# Patient Record
Sex: Male | Born: 1985
Health system: Southern US, Community
[De-identification: ages and names within clinical notes are randomized; demographics above are authoritative.]

## PROBLEM LIST (undated history)

## (undated) DIAGNOSIS — G709 Myoneural disorder, unspecified: Secondary | ICD-10-CM

## (undated) DIAGNOSIS — K219 Gastro-esophageal reflux disease without esophagitis: Secondary | ICD-10-CM

## (undated) DIAGNOSIS — F1910 Other psychoactive substance abuse, uncomplicated: Secondary | ICD-10-CM

## (undated) DIAGNOSIS — F329 Major depressive disorder, single episode, unspecified: Secondary | ICD-10-CM

## (undated) DIAGNOSIS — T7840XA Allergy, unspecified, initial encounter: Secondary | ICD-10-CM

## (undated) DIAGNOSIS — K5100 Ulcerative (chronic) pancolitis without complications: Secondary | ICD-10-CM

## (undated) DIAGNOSIS — F32A Depression, unspecified: Secondary | ICD-10-CM

## (undated) DIAGNOSIS — K5130 Ulcerative (chronic) rectosigmoiditis without complications: Secondary | ICD-10-CM

## (undated) DIAGNOSIS — F419 Anxiety disorder, unspecified: Secondary | ICD-10-CM

## (undated) HISTORY — DX: Ulcerative (chronic) pancolitis without complications: K51.00

## (undated) HISTORY — DX: Myoneural disorder, unspecified: G70.9

## (undated) HISTORY — DX: Gastro-esophageal reflux disease without esophagitis: K21.9

## (undated) HISTORY — DX: Allergy, unspecified, initial encounter: T78.40XA

## (undated) HISTORY — PX: OTHER SURGICAL HISTORY: SHX169

## (undated) HISTORY — DX: Depression, unspecified: F32.A

## (undated) HISTORY — DX: Anxiety disorder, unspecified: F41.9

## (undated) HISTORY — DX: Ulcerative (chronic) rectosigmoiditis without complications: K51.30

## (undated) HISTORY — DX: Major depressive disorder, single episode, unspecified: F32.9

## (undated) HISTORY — DX: Other psychoactive substance abuse, uncomplicated: F19.10

## (undated) HISTORY — PX: COLONOSCOPY: SHX174

---
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
## 2009-04-25 HISTORY — PX: HARDWARE REMOVAL: SHX979

## 2014-06-11 ENCOUNTER — Ambulatory Visit: Payer: 59 | Admitting: Internal Medicine

## 2014-06-12 ENCOUNTER — Ambulatory Visit (INDEPENDENT_AMBULATORY_CARE_PROVIDER_SITE_OTHER): Payer: 59 | Admitting: Internal Medicine

## 2014-06-12 ENCOUNTER — Encounter: Payer: Self-pay | Admitting: Internal Medicine

## 2014-06-12 VITALS — BP 118/80 | HR 69 | Temp 98.4°F | Resp 16 | Ht 69.0 in | Wt 183.0 lb

## 2014-06-12 DIAGNOSIS — Z0000 Encounter for general adult medical examination without abnormal findings: Secondary | ICD-10-CM | POA: Insufficient documentation

## 2014-06-12 DIAGNOSIS — F1011 Alcohol abuse, in remission: Secondary | ICD-10-CM | POA: Insufficient documentation

## 2014-06-12 DIAGNOSIS — F411 Generalized anxiety disorder: Secondary | ICD-10-CM

## 2014-06-12 NOTE — Assessment & Plan Note (Signed)
Recent labs done while in [facility] so no labs ordered today Vaccines were reviewed and documented Exam done Pt ed material was given

## 2014-06-12 NOTE — Progress Notes (Signed)
   Subjective:    Patient ID: Blake Chapman, male    DOB: January 24, 1986, 29 y.o.   MRN: [id number]  HPI  New to me he comes in to establish with a PCP - he is 4 months sober from EtOH after drinking heavily for years and developing a peripheral neuropathy. He completed about 3 months of inpt rehab at [facility]. He has improved quite a bit in the recent months. He tells me that a NCS and EMG were done in [place] by his father who is a pediatric neurologist.  Review of Systems  Constitutional: Negative.  Negative for fever, chills, diaphoresis, appetite change and fatigue.  HENT: Negative.   Eyes: Negative.   Respiratory: Negative.  Negative for cough, choking, shortness of breath and stridor.   Cardiovascular: Negative.  Negative for chest pain, palpitations and leg swelling.  Gastrointestinal: Negative.  Negative for nausea, vomiting, abdominal pain, diarrhea, constipation and blood in stool.  Endocrine: Negative.   Genitourinary: Negative.   Musculoskeletal: Negative.   Skin: Negative.   Allergic/Immunologic: Negative.   Neurological: Negative.   Hematological: Negative.  Negative for adenopathy. Does not bruise/bleed easily.  Psychiatric/Behavioral: Positive for dysphoric mood. Negative for suicidal ideas, hallucinations, behavioral problems, confusion, sleep disturbance, self-injury, decreased concentration and agitation. The patient is nervous/anxious. The patient is not hyperactive.        Objective:   Physical Exam  Constitutional: He is oriented to person, place, and time. He appears well-developed and well-nourished. No distress.  HENT:  Head: Normocephalic and atraumatic.  Mouth/Throat: Oropharynx is clear and moist. No oropharyngeal exudate.  Eyes: Conjunctivae are normal. Right eye exhibits no discharge. Left eye exhibits no discharge. No scleral icterus.  Neck: Normal range of motion. Neck supple. No JVD present. No tracheal deviation present. No thyromegaly present.    Cardiovascular: Normal rate, regular rhythm, normal heart sounds and intact distal pulses.  Exam reveals no gallop and no friction rub.   No murmur heard. Pulmonary/Chest: Effort normal and breath sounds normal. No stridor. No respiratory distress. He has no wheezes. He has no rales. He exhibits no tenderness.  Abdominal: Soft. Bowel sounds are normal. He exhibits no distension and no mass. There is no tenderness. There is no rebound and no guarding.  Musculoskeletal: Normal range of motion. He exhibits no edema or tenderness.  Lymphadenopathy:    He has no cervical adenopathy.  Neurological: He is oriented to person, place, and time.  Skin: Skin is warm and dry. No rash noted. He is not diaphoretic. No erythema. No pallor.  Vitals reviewed.   No results found for: WBC, HGB, HCT, PLT, GLUCOSE, CHOL, TRIG, HDL, LDLDIRECT, LDLCALC, ALT, AST, NA, K, CL, CREATININE, BUN, CO2, TSH, PSA, INR, GLUF, HGBA1C, MICROALBUR      Assessment & Plan:

## 2014-06-12 NOTE — Patient Instructions (Signed)

## 2014-10-07 ENCOUNTER — Telehealth: Payer: Self-pay | Admitting: Internal Medicine

## 2014-10-07 MED ORDER — GABAPENTIN 600 MG PO TABS: 600.0000 mg | ORAL_TABLET | Freq: Three times a day (TID) | ORAL | Status: DC

## 2014-10-07 MED ORDER — PROPRANOLOL HCL 10 MG PO TABS: 10.0000 mg | ORAL_TABLET | ORAL | Status: DC | PRN

## 2014-10-07 MED ORDER — DULOXETINE HCL 60 MG PO CPEP: 60.0000 mg | ORAL_CAPSULE | Freq: Two times a day (BID) | ORAL | Status: DC

## 2014-10-07 NOTE — Telephone Encounter (Signed)
Notified pt refill sent to pharmacy...Blake Chapman

## 2014-10-07 NOTE — Telephone Encounter (Signed)
Pt called in and it is time for his refills, he needs refill on:  Gabapentin Propranolol Duloxetine    [facility] - pharmacy

## 2014-11-05 ENCOUNTER — Other Ambulatory Visit: Payer: Self-pay

## 2014-11-05 MED ORDER — PROPRANOLOL HCL 10 MG PO TABS: 10.0000 mg | ORAL_TABLET | ORAL | Status: DC | PRN

## 2015-01-09 ENCOUNTER — Other Ambulatory Visit: Payer: Self-pay

## 2015-01-09 MED ORDER — PROPRANOLOL HCL 10 MG PO TABS: 10.0000 mg | ORAL_TABLET | ORAL | Status: DC | PRN

## 2015-04-09 ENCOUNTER — Other Ambulatory Visit: Payer: Self-pay | Admitting: Internal Medicine

## 2015-08-10 ENCOUNTER — Other Ambulatory Visit: Payer: Self-pay | Admitting: Internal Medicine

## 2015-09-10 ENCOUNTER — Other Ambulatory Visit: Payer: Self-pay | Admitting: Internal Medicine

## 2015-09-17 ENCOUNTER — Encounter: Payer: Self-pay | Admitting: Internal Medicine

## 2015-09-17 ENCOUNTER — Ambulatory Visit (INDEPENDENT_AMBULATORY_CARE_PROVIDER_SITE_OTHER): Payer: 59 | Admitting: Internal Medicine

## 2015-09-17 ENCOUNTER — Other Ambulatory Visit (INDEPENDENT_AMBULATORY_CARE_PROVIDER_SITE_OTHER): Payer: 59

## 2015-09-17 VITALS — BP 120/86 | HR 78 | Temp 98.9°F | Resp 16 | Ht 69.0 in | Wt 165.0 lb

## 2015-09-17 DIAGNOSIS — Z0000 Encounter for general adult medical examination without abnormal findings: Secondary | ICD-10-CM

## 2015-09-17 DIAGNOSIS — F1011 Alcohol abuse, in remission: Secondary | ICD-10-CM

## 2015-09-17 DIAGNOSIS — F411 Generalized anxiety disorder: Secondary | ICD-10-CM

## 2015-09-17 DIAGNOSIS — F1010 Alcohol abuse, uncomplicated: Secondary | ICD-10-CM | POA: Diagnosis not present

## 2015-09-17 DIAGNOSIS — K648 Other hemorrhoids: Secondary | ICD-10-CM | POA: Diagnosis not present

## 2015-09-17 DIAGNOSIS — K921 Melena: Secondary | ICD-10-CM

## 2015-09-17 LAB — CBC WITH DIFFERENTIAL/PLATELET
BASOS ABS: 0 10*3/uL (ref 0.0–0.1)
Basophils Relative: 0.5 % (ref 0.0–3.0)
Eosinophils Absolute: 0.4 10*3/uL (ref 0.0–0.7)
Eosinophils Relative: 5.1 % — ABNORMAL HIGH (ref 0.0–5.0)
HCT: 41.8 % (ref 39.0–52.0)
Hemoglobin: 14.4 g/dL (ref 13.0–17.0)
LYMPHS ABS: 1.3 10*3/uL (ref 0.7–4.0)
Lymphocytes Relative: 16.4 % (ref 12.0–46.0)
MCHC: 34.5 g/dL (ref 30.0–36.0)
MCV: 89.3 fl (ref 78.0–100.0)
MONO ABS: 0.6 10*3/uL (ref 0.1–1.0)
MONOS PCT: 7.8 % (ref 3.0–12.0)
NEUTROS ABS: 5.4 10*3/uL (ref 1.4–7.7)
NEUTROS PCT: 70.2 % (ref 43.0–77.0)
PLATELETS: 284 10*3/uL (ref 150.0–400.0)
RBC: 4.68 Mil/uL (ref 4.22–5.81)
RDW: 13.2 % (ref 11.5–15.5)
WBC: 7.7 10*3/uL (ref 4.0–10.5)

## 2015-09-17 LAB — LIPID PANEL
CHOLESTEROL: 169 mg/dL (ref 0–200)
HDL: 50.5 mg/dL (ref >39.00)
LDL Cholesterol: 90 mg/dL (ref 0–99)
NONHDL: 118.41
TRIGLYCERIDES: 142 mg/dL (ref 0.0–149.0)
Total CHOL/HDL Ratio: 3
VLDL: 28.4 mg/dL (ref 0.0–40.0)

## 2015-09-17 LAB — COMPREHENSIVE METABOLIC PANEL
ALT: 20 U/L (ref 0–53)
AST: 24 U/L (ref 0–37)
Albumin: 4.5 g/dL (ref 3.5–5.2)
Alkaline Phosphatase: 48 U/L (ref 39–117)
BILIRUBIN TOTAL: 0.3 mg/dL (ref 0.2–1.2)
BUN: 8 mg/dL (ref 6–23)
CALCIUM: 9.5 mg/dL (ref 8.4–10.5)
CO2: 31 meq/L (ref 19–32)
CREATININE: 0.95 mg/dL (ref 0.40–1.50)
Chloride: 101 mEq/L (ref 96–112)
GFR: 97.61 mL/min (ref >60.00)
Glucose, Bld: 75 mg/dL (ref 70–99)
Potassium: 4.7 mEq/L (ref 3.5–5.1)
Sodium: 136 mEq/L (ref 135–145)
TOTAL PROTEIN: 7.2 g/dL (ref 6.0–8.3)

## 2015-09-17 LAB — TSH: TSH: 1.15 u[IU]/mL (ref 0.35–4.50)

## 2015-09-17 LAB — FECAL OCCULT BLOOD, GUAIAC: Fecal Occult Blood: POSITIVE

## 2015-09-17 MED ORDER — DULOXETINE HCL 60 MG PO CPEP: ORAL_CAPSULE | ORAL | Status: DC

## 2015-09-17 MED ORDER — NALTREXONE HCL 50 MG PO TABS: 50.0000 mg | ORAL_TABLET | Freq: Every day | ORAL | Status: DC

## 2015-09-17 MED ORDER — PROPRANOLOL HCL 10 MG PO TABS: 10.0000 mg | ORAL_TABLET | Freq: Every day | ORAL | Status: DC | PRN

## 2015-09-17 NOTE — Progress Notes (Signed)
Subjective:  Patient ID: Blake Chapman, male    DOB: 04-12-1986  Age: 30 y.o. MRN: [id number]  CC: Annual Exam; Depression; and Blood In Stools   HPI Blake Chapman presents for a CPX.  He complains of a 2-3 week history of painless but bloody stools with blood on the toilet paper.  He has performance anxiety and occasionally takes propranolol for symptom relief. Since I last saw him he has been able to stop taking gabapentin and mirtazapine. He continues to abstain from alcohol intake and is doing well on duloxetine at the current dose of twice a day. He complains of low libido and erectile dysfunction. He asked me to fill a prescription for naltrexone. He tells me that he is still having episodes of craving alcohol and when he was in rehab he took naltrexone and said it helped him quite a bit and he tolerated it very well.  Outpatient Prescriptions Prior to Visit  Medication Sig Dispense Refill  . fluticasone (FLONASE) 50 MCG/ACT nasal spray Place into both nostrils daily.    . Loratadine (CLARITIN PO) Take by mouth.    . Multiple Vitamin (MULTIVITAMIN) capsule Take 1 capsule by mouth daily.    Marland Kitchen VITAMIN D, CHOLECALCIFEROL, PO Take by mouth.    . DULoxetine (CYMBALTA) 60 MG capsule TAKE 1 CAPSULE (60 MG TOTAL) BY MOUTH 2 (TWO) TIMES DAILY. 60 capsule 5  . MELATONIN PO Take by mouth.    . Mirtazapine (REMERON PO) Take by mouth.    . propranolol (INDERAL) 10 MG tablet Take 1 tablet (10 mg total) by mouth daily as needed. Overdue for yearlyphysical w/labs must see md for refills 30 tablet 0  . gabapentin (NEURONTIN) 600 MG tablet Take 1 tablet (600 mg total) by mouth 3 (three) times daily. 90 tablet 5   No facility-administered medications prior to visit.    ROS Review of Systems  Constitutional: Negative.  Negative for fever, chills, diaphoresis, appetite change and fatigue.  HENT: Negative.  Negative for sore throat and trouble swallowing.   Eyes: Negative.  Negative for visual  disturbance.  Respiratory: Negative.  Negative for cough, choking, chest tightness, shortness of breath and stridor.   Cardiovascular: Negative.  Negative for chest pain, palpitations and leg swelling.  Gastrointestinal: Positive for blood in stool and anal bleeding. Negative for nausea, vomiting, abdominal pain, diarrhea and constipation.  Endocrine: Negative.   Genitourinary: Negative.  Negative for dysuria, frequency, decreased urine volume, discharge, penile swelling, scrotal swelling, difficulty urinating and testicular pain.  Musculoskeletal: Negative.  Negative for myalgias, back pain, joint swelling, arthralgias and neck pain.  Skin: Negative.  Negative for color change and rash.  Allergic/Immunologic: Negative.   Neurological: Negative.   Hematological: Negative.  Negative for adenopathy. Does not bruise/bleed easily.  Psychiatric/Behavioral: Negative.     Objective:  BP 120/86 mmHg  Pulse 78  Temp(Src) 98.9 F (37.2 C) (Oral)  Resp 16  Ht 5' 9"  (1.753 m)  Wt 165 lb (74.844 kg)  BMI 24.36 kg/m2  SpO2 98%  BP Readings from Last 3 Encounters:  09/17/15 120/86  06/12/14 118/80    Wt Readings from Last 3 Encounters:  09/17/15 165 lb (74.844 kg)  06/12/14 183 lb (83.008 kg)    Physical Exam  Constitutional: He is oriented to person, place, and time. He appears well-developed and well-nourished. No distress.  HENT:  Nose: Nose normal.  Mouth/Throat: Oropharynx is clear and moist. No oropharyngeal exudate.  Eyes: Conjunctivae are normal. Right eye exhibits no  discharge. Left eye exhibits no discharge. No scleral icterus.  Neck: Normal range of motion. Neck supple. No JVD present. No tracheal deviation present. No thyromegaly present.  Cardiovascular: Normal rate, regular rhythm, normal heart sounds and intact distal pulses.  Exam reveals no gallop and no friction rub.   No murmur heard. Pulmonary/Chest: Effort normal and breath sounds normal. No stridor. No respiratory  distress. He has no wheezes. He has no rales. He exhibits no tenderness.  Abdominal: Soft. Bowel sounds are normal. He exhibits no distension and no mass. There is no tenderness. There is no rebound and no guarding. Hernia confirmed negative in the right inguinal area and confirmed negative in the left inguinal area.  Genitourinary: Prostate normal, testes normal and penis normal. Rectal exam shows external hemorrhoid and internal hemorrhoid. Rectal exam shows no fissure, no mass, no tenderness and anal tone normal. Guaiac positive stool. Prostate is not enlarged and not tender. Right testis shows no mass, no swelling and no tenderness. Right testis is descended. Left testis shows no mass, no swelling and no tenderness. Left testis is descended. Circumcised. No penile tenderness. No discharge found.  There is a small, uncomplicated external anal hemorrhoid on the left side. There are diffuse, circumferential internal anal hemorrhoids with no complications.  Musculoskeletal: Normal range of motion. He exhibits no edema or tenderness.  Lymphadenopathy:    He has no cervical adenopathy.       Right: No inguinal adenopathy present.       Left: No inguinal adenopathy present.  Neurological: He is oriented to person, place, and time.  Skin: Skin is warm and dry. No rash noted. He is not diaphoretic. No erythema. No pallor.  Psychiatric: He has a normal mood and affect. His behavior is normal. Judgment and thought content normal.  Vitals reviewed.   No results found for: WBC, HGB, HCT, PLT, GLUCOSE, CHOL, TRIG, HDL, LDLDIRECT, LDLCALC, ALT, AST, NA, K, CL, CREATININE, BUN, CO2, TSH, PSA, INR, GLUF, HGBA1C, MICROALBUR  Patient was never admitted.  Assessment & Plan:   Guthrie was seen today for annual exam, depression and blood in stools.  Diagnoses and all orders for this visit:  GAD (generalized anxiety disorder)- this has improved significantly over the last year, he has been able to stop taking  Neurontin and mirtazapine, he has side effects with duloxetine in the 120 mg a day so I instructed him to decrease to 60 mg once a day and will reevaluate him after several months at the lower dose. -     propranolol (INDERAL) 10 MG tablet; Take 1 tablet (10 mg total) by mouth daily as needed. -     DULoxetine (CYMBALTA) 60 MG capsule; TAKE 1 CAPSULE (60 MG TOTAL) BY MOUTH 2 (TWO) TIMES DAILY.  Blood in stool- I believe this is due to the internal and external anal hemorrhoids, will refer to GI to see if lower endoscopy is indicated -     Ambulatory referral to Gastroenterology  Internal hemorrhoid, bleeding- I referred him to gastroenterology to see if a banding procedure is indicated -     Ambulatory referral to Gastroenterology  Alcohol abuse, in remission- he will restart naltrexone -     naltrexone (DEPADE) 50 MG tablet; Take 1 tablet (50 mg total) by mouth daily.  Routine general medical examination at a health care facility -     Lipid panel; Future -     TSH; Future -     Urinalysis, Routine w reflex microscopic (not  at [facility]); Future -     Comprehensive metabolic panel; Future -     CBC with Differential/Platelet; Future -     HIV antibody; Future  I have discontinued Mr. Guinyard's MELATONIN PO, Mirtazapine (REMERON PO), and gabapentin. I have also changed his propranolol. Additionally, I am having him start on naltrexone. Lastly, I am having him maintain his multivitamin, Loratadine (CLARITIN PO), fluticasone, (VITAMIN D, CHOLECALCIFEROL, PO), and DULoxetine.  Meds ordered this encounter  Medications  . propranolol (INDERAL) 10 MG tablet    Sig: Take 1 tablet (10 mg total) by mouth daily as needed.    Dispense:  30 tablet    Refill:  6  . DULoxetine (CYMBALTA) 60 MG capsule    Sig: TAKE 1 CAPSULE (60 MG TOTAL) BY MOUTH 2 (TWO) TIMES DAILY.    Dispense:  60 capsule    Refill:  11  . naltrexone (DEPADE) 50 MG tablet    Sig: Take 1 tablet (50 mg total) by mouth daily.     Dispense:  30 tablet    Refill:  5     Follow-up: Return in about 6 months (around 03/19/2016).  Scarlette Calico, MD

## 2015-09-17 NOTE — Progress Notes (Signed)
Pre visit review using our clinic review tool, if applicable. No additional management support is needed unless otherwise documented below in the visit note. 

## 2015-09-17 NOTE — Patient Instructions (Signed)

## 2015-09-18 ENCOUNTER — Encounter: Payer: Self-pay | Admitting: Internal Medicine

## 2015-09-18 LAB — HIV ANTIBODY (ROUTINE TESTING W REFLEX): HIV 1&2 Ab, 4th Generation: NONREACTIVE

## 2015-10-05 ENCOUNTER — Other Ambulatory Visit: Payer: Self-pay | Admitting: Internal Medicine

## 2015-11-02 ENCOUNTER — Encounter: Payer: Self-pay | Admitting: Gastroenterology

## 2015-12-07 ENCOUNTER — Ambulatory Visit: Payer: 59 | Admitting: Gastroenterology

## 2016-01-13 ENCOUNTER — Ambulatory Visit: Payer: 59 | Admitting: Gastroenterology

## 2016-03-16 ENCOUNTER — Ambulatory Visit (INDEPENDENT_AMBULATORY_CARE_PROVIDER_SITE_OTHER): Payer: 59 | Admitting: Gastroenterology

## 2016-03-16 ENCOUNTER — Encounter: Payer: Self-pay | Admitting: Gastroenterology

## 2016-03-16 ENCOUNTER — Encounter (INDEPENDENT_AMBULATORY_CARE_PROVIDER_SITE_OTHER): Payer: Self-pay

## 2016-03-16 VITALS — BP 110/70 | HR 72 | Ht 68.0 in | Wt 172.0 lb

## 2016-03-16 DIAGNOSIS — K625 Hemorrhage of anus and rectum: Secondary | ICD-10-CM

## 2016-03-16 MED ORDER — NA SULFATE-K SULFATE-MG SULF 17.5-3.13-1.6 GM/177ML PO SOLN: 1.0000 | Freq: Once | ORAL | 0 refills | Status: AC

## 2016-03-16 NOTE — Progress Notes (Signed)
HPI: This is a  Very pleasant 30 yo man who was referred to me by Janith Lima, MD  to evaluate  Intermittent rectal bleeding    Chief complaint is intermittent rectal bleeding.  Started months ago, was just once in a while but more recently he Sees a small amount of blood with justa bout every BM.    Solid stools.    No anal discomfort.  Not having bloody diarrhea  No constipation.  No creams, ointments.    90 yo grandmother recently diagnosed with colon cancer.    08/2015 FOBT + (in office DRE) 08/2015 CBC was normal.   Review of systems: Pertinent positive and negative review of systems were noted in the above HPI section. Complete review of systems was performed and was otherwise normal.   Past Medical History:  Diagnosis Date  . Anxiety   . Depression     Past Surgical History:  Procedure Laterality Date  . HARDWARE REMOVAL  2011  . HERNIA REPAIR  1991    Current Outpatient Prescriptions  Medication Sig Dispense Refill  . Ascorbic Acid (VITAMIN C) 100 MG tablet Take 100 mg by mouth daily.    . DULoxetine (CYMBALTA) 60 MG capsule TAKE 1 CAPSULE (60 MG TOTAL) BY MOUTH 2 (TWO) TIMES DAILY. 60 capsule 11  . fluticasone (FLONASE) 50 MCG/ACT nasal spray Place into both nostrils daily.    . Loratadine (CLARITIN PO) Take by mouth.    . Multiple Vitamin (MULTIVITAMIN) capsule Take 1 capsule by mouth daily.    . naltrexone (DEPADE) 50 MG tablet Take 1 tablet (50 mg total) by mouth daily. 30 tablet 5  . omeprazole (PRILOSEC) 10 MG capsule Take 10 mg by mouth daily.    . propranolol (INDERAL) 10 MG tablet Take 1 tablet (10 mg total) by mouth daily as needed. 30 tablet 6  . thiamine (VITAMIN B-1) 100 MG tablet Take 100 mg by mouth daily.    Marland Kitchen VITAMIN D, CHOLECALCIFEROL, PO Take by mouth.     No current facility-administered medications for this visit.     Allergies as of 03/16/2016  . (No Known Allergies)    Family History  Problem Relation Age of Onset  .  Thyroid cancer Mother   . Breast cancer Maternal Grandmother   . Colon cancer Maternal Grandmother   . Prostate cancer Maternal Uncle   . Cancer Neg Hx   . Alcohol abuse Neg Hx   . Depression Neg Hx   . Diabetes Neg Hx   . Drug abuse Neg Hx   . Early death Neg Hx   . Heart disease Neg Hx   . Stroke Neg Hx   . Kidney disease Neg Hx   . Hypertension Neg Hx   . Hyperlipidemia Neg Hx     Social History   Social History  . Marital status: Single    Spouse name: N/A  . Number of children: 0  . Years of education: N/A   Occupational History  . Attorney    Social History Main Topics  . Smoking status: Never Smoker  . Smokeless tobacco: Never Used  . Alcohol use No     Comment: Alcoholism- remission  . Drug use: No  . Sexual activity: Not Currently    Partners: Female   Other Topics Concern  . Not on file   Social History Narrative  . No narrative on file     Physical Exam: BP 110/70 (BP Location: Left Arm, Patient Position: Sitting,  Cuff Size: Normal)   Pulse 72   Ht 5' 8"  (1.727 m) Comment: height measured without shoes  Wt 172 lb (78 kg)   BMI 26.15 kg/m  Constitutional: generally well-appearing Psychiatric: alert and oriented x3 Eyes: extraocular movements intact Mouth: oral pharynx moist, no lesions Neck: supple no lymphadenopathy Cardiovascular: heart regular rate and rhythm Lungs: clear to auscultation bilaterally Abdomen: soft, nontender, nondistended, no obvious ascites, no peritoneal signs, normal bowel sounds Extremities: no lower extremity edema bilaterally Skin: no lesions on visible extremities Rectal exam: small, deflated mid line posterior hemorrhoid.  Not ulcerated. No anal fissures. No distal rectal masses.  Stool was not checked for hemocult  Assessment and plan: 30 y.o. male with  Minor rectal bleeding, fobt +  Small external anal hemorrhoids on exam.  Not sure this is the source.  I recommended we proceed with colonoscopy at his soonest  convenience to exclude advanced neoplasm which I think is unlikely.     Owens Loffler, MD [facility] 03/16/2016, 11:31 AM  Cc: Janith Lima, MD

## 2016-03-16 NOTE — Patient Instructions (Addendum)
You will be set up for a colonoscopy for rectal bleeding, FOBT + stool.  If you are age 32 or older, your body mass index should be between 23-30. Your Body mass index is 26.15 kg/m. If this is out of the aforementioned range listed, please consider follow up with your Primary Care Provider.  If you are age 66 or younger, your body mass index should be between 19-25. Your Body mass index is 26.15 kg/m. If this is out of the aformentioned range listed, please consider follow up with your Primary Care Provider.   We have sent the following medications to your pharmacy for you to pick up at your convenience:  [place] have been scheduled for a colonoscopy. Please follow written instructions given to you at your visit today.  Please pick up your prep supplies at the pharmacy within the next 1-3 days. If you use inhalers (even only as needed), please bring them with you on the day of your procedure. Your physician has requested that you go to www.startemmi.com and enter the access code given to you at your visit today. This web site gives a general overview about your procedure. However, you should still follow specific instructions given to you by our office regarding your preparation for the procedure.

## 2016-03-28 ENCOUNTER — Other Ambulatory Visit: Payer: Self-pay | Admitting: Internal Medicine

## 2016-03-28 DIAGNOSIS — F1011 Alcohol abuse, in remission: Secondary | ICD-10-CM

## 2016-04-04 ENCOUNTER — Ambulatory Visit (AMBULATORY_SURGERY_CENTER): Payer: 59 | Admitting: Gastroenterology

## 2016-04-04 ENCOUNTER — Encounter: Payer: Self-pay | Admitting: Gastroenterology

## 2016-04-04 VITALS — BP 106/63 | HR 84 | Temp 98.0°F | Resp 12 | Ht 68.0 in | Wt 172.0 lb

## 2016-04-04 DIAGNOSIS — K529 Noninfective gastroenteritis and colitis, unspecified: Secondary | ICD-10-CM | POA: Diagnosis not present

## 2016-04-04 DIAGNOSIS — K5120 Ulcerative (chronic) proctitis without complications: Secondary | ICD-10-CM

## 2016-04-04 DIAGNOSIS — K625 Hemorrhage of anus and rectum: Secondary | ICD-10-CM

## 2016-04-04 MED ORDER — SODIUM CHLORIDE 0.9 % IV SOLN: 500.0000 mL | INTRAVENOUS | Status: DC

## 2016-04-04 MED ORDER — MESALAMINE 1.2 G PO TBEC: 2.4000 g | DELAYED_RELEASE_TABLET | Freq: Every day | ORAL | 6 refills | Status: DC

## 2016-04-04 MED ORDER — MESALAMINE 4 G RE ENEM: 4.0000 g | ENEMA | Freq: Every day | RECTAL | 6 refills | Status: DC

## 2016-04-04 NOTE — Patient Instructions (Signed)
Discharge instructions given. Biopsies taken. Resume previous medications. YOU HAD AN ENDOSCOPIC PROCEDURE TODAY AT THE [facility]:   Refer to the procedure report that was given to you for any specific questions about what was found during the examination.  If the procedure report does not answer your questions, please call your gastroenterologist to clarify.  If you requested that your care partner not be given the details of your procedure findings, then the procedure report has been included in a sealed envelope for you to review at your convenience later.  YOU SHOULD EXPECT: Some feelings of bloating in the abdomen. Passage of more gas than usual.  Walking can help get rid of the air that was put into your GI tract during the procedure and reduce the bloating. If you had a lower endoscopy (such as a colonoscopy or flexible sigmoidoscopy) you may notice spotting of blood in your stool or on the toilet paper. If you underwent a bowel prep for your procedure, you may not have a normal bowel movement for a few days.  Please Note:  You might notice some irritation and congestion in your nose or some drainage.  This is from the oxygen used during your procedure.  There is no need for concern and it should clear up in a day or so.  SYMPTOMS TO REPORT IMMEDIATELY:   Following lower endoscopy (colonoscopy or flexible sigmoidoscopy):  Excessive amounts of blood in the stool  Significant tenderness or worsening of abdominal pains  Swelling of the abdomen that is new, acute  Fever of 100F or higher   For urgent or emergent issues, a gastroenterologist can be reached at any hour by calling [phone].   DIET:  We do recommend a small meal at first, but then you may proceed to your regular diet.  Drink plenty of fluids but you should avoid alcoholic beverages for 24 hours.  ACTIVITY:  You should plan to take it easy for the rest of today and you should NOT DRIVE or use heavy  machinery until tomorrow (because of the sedation medicines used during the test).    FOLLOW UP: Our staff will call the number listed on your records the next business day following your procedure to check on you and address any questions or concerns that you may have regarding the information given to you following your procedure. If we do not reach you, we will leave a message.  However, if you are feeling well and you are not experiencing any problems, there is no need to return our call.  We will assume that you have returned to your regular daily activities without incident.  If any biopsies were taken you will be contacted by phone or by letter within the next 1-3 weeks.  Please call us at [phone] if you have not heard about the biopsies in 3 weeks.    SIGNATURES/CONFIDENTIALITY: You and/or your care partner have signed paperwork which will be entered into your electronic medical record.  These signatures attest to the fact that that the information above on your After Visit Summary has been reviewed and is understood.  Full responsibility of the confidentiality of this discharge information lies with you and/or your care-partner. 

## 2016-04-04 NOTE — Progress Notes (Signed)
To recovery, report to McCoy, RN, VSS 

## 2016-04-04 NOTE — Progress Notes (Signed)
Called to room to assist during endoscopic procedure.  Patient ID and intended procedure confirmed with present staff. Received instructions for my participation in the procedure from the performing physician.  

## 2016-04-04 NOTE — Op Note (Signed)
[facility] Patient Name: Blake Chapman Procedure Date: 04/04/2016 8:32 AM MRN: [id number] Endoscopist: Milus Banister , MD Age: 30 Referring MD:  Date of Birth: 1986/01/14 Gender: Male Account #: [id number] Procedure:                Colonoscopy Indications:              Hematochezia, Heme positive stool Medicines:                Monitored Anesthesia Care Procedure:                Pre-Anesthesia Assessment:                           - Prior to the procedure, a History and Physical                            was performed, and patient medications and                            allergies were reviewed. The patient's tolerance of                            previous anesthesia was also reviewed. The risks                            and benefits of the procedure and the sedation                            options and risks were discussed with the patient.                            All questions were answered, and informed consent                            was obtained. Prior Anticoagulants: The patient has                            taken no previous anticoagulant or antiplatelet                            agents. ASA Grade Assessment: II - A patient with                            mild systemic disease. After reviewing the risks                            and benefits, the patient was deemed in                            satisfactory condition to undergo the procedure.                           After obtaining informed consent, the colonoscope  was passed under direct vision. Throughout the                            procedure, the patient's blood pressure, pulse, and                            oxygen saturations were monitored continuously. The                            Model CF-HQ190L [id number]) scope was introduced                            through the anus and advanced to the the cecum,                            identified by appendiceal  orifice and ileocecal                            valve. The colonoscopy was performed without                            difficulty. The patient tolerated the procedure                            well. The quality of the bowel preparation was                            good. The ileocecal valve, appendiceal orifice, and                            rectum were photographed. Scope In: 8:35:57 AM Scope Out: 8:49:06 AM Scope Withdrawal Time: 0 hours 10 minutes 15 seconds  Total Procedure Duration: 0 hours 13 minutes 9 seconds  Findings:                 Diffuse mild inflammation characterized by                            erythema, friability and granularity was found in                            the recto-sigmoid colon (from anus to 25-30cm from                            the anus) Biopsies were taken with a cold forceps                            for histology. The colon was otherwise normal.                            Biopsies were taken from right and left colon                            segments as well. I attempted terminal ileum  intubation but was not successful.                           The exam was otherwise without abnormality on                            direct and retroflexion views. Complications:            No immediate complications. Estimated blood loss:                            None. Estimated Blood Loss:     Estimated blood loss: none. Impression:               - Diffuse mild inflammation was found in the                            recto-sigmoid colon secondary to proctosigmoid                            colitis (from anus to 25-30cm from the anus).                            Biopsies taken from overt inflammation in                            rectosigmoid (jar 3), normal left colon (jar 2) and                            normal right colon (jar 1).                           - The examination was otherwise normal on direct                             and retroflexion views. Recommendation:           - Patient has a contact number available for                            emergencies. The signs and symptoms of potential                            delayed complications were discussed with the                            patient. Return to normal activities tomorrow.                            Written discharge instructions were provided to the                            patient.                           - Resume previous diet.                           -  Will call in oral and per rectum meslamine. Take                            two pills once daily (lialda) and insert enema once                            nightly.                           - Office visit with Dr. Ardis Hughs in 2 months.                           - Await pathology results. Milus Banister, MD 04/04/2016 8:57:07 AM This report has been signed electronically.

## 2016-04-05 ENCOUNTER — Telehealth: Payer: Self-pay

## 2016-04-05 NOTE — Telephone Encounter (Signed)
  Follow up Call-  Call back number 04/04/2016  Post procedure Call Back phone  # #[phone] cell  Permission to leave phone message Yes     Patient questions:  Do you have a fever, pain , or abdominal swelling? No. Pain Score  0 *  Have you tolerated food without any problems? Yes.    Have you been able to return to your normal activities? Yes.    Do you have any questions about your discharge instructions: Diet   No. Medications  No. Follow up visit  No.  Do you have questions or concerns about your Care? No.  Actions: * If pain score is 4 or above: No action needed, pain <4.

## 2016-04-05 NOTE — Telephone Encounter (Signed)
Name [place] message,follow up

## 2016-04-06 ENCOUNTER — Telehealth: Payer: Self-pay | Admitting: Gastroenterology

## 2016-04-07 ENCOUNTER — Telehealth: Payer: Self-pay | Admitting: Gastroenterology

## 2016-04-07 ENCOUNTER — Other Ambulatory Visit: Payer: Self-pay | Admitting: Internal Medicine

## 2016-04-07 DIAGNOSIS — F411 Generalized anxiety disorder: Secondary | ICD-10-CM

## 2016-04-07 MED ORDER — MESALAMINE 1.2 G PO TBEC: 2.4000 g | DELAYED_RELEASE_TABLET | Freq: Every day | ORAL | 0 refills | Status: DC

## 2016-04-07 NOTE — Telephone Encounter (Signed)
I spoke with the pt and advised him that I would send in the generic form.  He will call back if this is still too expensive.

## 2016-04-07 NOTE — Telephone Encounter (Signed)
Dr Christella HartiganJacobs is there anything else the pt can take other than Lialda, I have already tried everything I can with the drug company and its still $250 a month.  Please advise

## 2016-04-13 ENCOUNTER — Encounter: Payer: Self-pay | Admitting: Gastroenterology

## 2016-04-13 ENCOUNTER — Telehealth: Payer: Self-pay | Admitting: Gastroenterology

## 2016-04-13 NOTE — Telephone Encounter (Signed)
See alternate note  

## 2016-04-14 MED ORDER — MESALAMINE ER 0.375 G PO CP24: 650.0000 mg | ORAL_CAPSULE | Freq: Every day | ORAL | 6 refills | Status: DC

## 2016-04-14 NOTE — Telephone Encounter (Signed)
Can you try apriso 2 pills once daily.   Thanks

## 2016-04-14 NOTE — Telephone Encounter (Signed)
Prescription has been sent and message left for pt to return call

## 2016-04-14 NOTE — Telephone Encounter (Signed)
Pt has been notified that Apriso was sent in and he will call with any problems at the pharmacy.  Apriso savings card faxed to the pt to take to the pharmacy

## 2016-04-14 NOTE — Telephone Encounter (Signed)
Dr Christella HartiganJacobs did I send this to you?

## 2016-06-06 ENCOUNTER — Telehealth: Payer: Self-pay | Admitting: Gastroenterology

## 2016-06-06 NOTE — Telephone Encounter (Signed)
The pt states he was scheduled to see Dr Christella HartiganJacobs in April but was told to follow up in Feb.  I have added him to the wait list.  He also states he is doing well but the medication is 120 per week.  He will discuss with Dr Christella HartiganJacobs the need to to continue at his office visit.

## 2016-07-19 ENCOUNTER — Other Ambulatory Visit: Payer: Self-pay | Admitting: Internal Medicine

## 2016-07-19 DIAGNOSIS — F1011 Alcohol abuse, in remission: Secondary | ICD-10-CM

## 2016-08-15 ENCOUNTER — Ambulatory Visit (INDEPENDENT_AMBULATORY_CARE_PROVIDER_SITE_OTHER): Payer: 59 | Admitting: Gastroenterology

## 2016-08-15 ENCOUNTER — Encounter (INDEPENDENT_AMBULATORY_CARE_PROVIDER_SITE_OTHER): Payer: Self-pay

## 2016-08-15 ENCOUNTER — Encounter: Payer: Self-pay | Admitting: Gastroenterology

## 2016-08-15 VITALS — BP 122/86 | HR 80 | Ht 68.0 in | Wt 168.4 lb

## 2016-08-15 DIAGNOSIS — K5120 Ulcerative (chronic) proctitis without complications: Secondary | ICD-10-CM

## 2016-08-15 MED ORDER — MESALAMINE 1000 MG RE SUPP: 1000.0000 mg | Freq: Every day | RECTAL | 6 refills | Status: DC

## 2016-08-15 NOTE — Progress Notes (Signed)
Review of pertinent gastrointestinal problems: 1. Distal UC; presented 2017 with intermittent rectal bleeding. Colonoscopy December 2017 Dr. Ardis Hughs found moderate inflammation from rectum up to 25 cm. The rest of the colon was normal. Terminal ileum could not be intubated. Biopsies from the inflamed segment showed chronic active inflammation. Biopsies from non-inflamed appearing segments of colon in the right and left segments were all normal. He was started on mesalamine enema and mesalamine orally.   HPI: This is a   very pleasant 31 year old man whom I last saw about 5 months with time of colonoscopy. See those results summarized above  Chief complaint is limited ulcerative colitis, proctitis  Enemas daily, apriso 2 pills once daily.  Noticed blood was gone after 3 days.  Never really had spasms or urgency.  He is really doing well.  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  Constitutional:  No unintentional weight loss   Past Medical History:  Diagnosis Date  . Allergy   . Anxiety   . Depression   . GERD (gastroesophageal reflux disease)   . Neuromuscular disorder ([place])    acute bil lower legs neuropathy  . Substance abuse    alcohol - previous    Past Surgical History:  Procedure Laterality Date  . clavical - right repair     2010  . HARDWARE REMOVAL  2011  . HERNIA REPAIR  1991    Current Outpatient Prescriptions  Medication Sig Dispense Refill  . Ascorbic Acid (VITAMIN C) 100 MG tablet Take 100 mg by mouth daily.    . DULoxetine (CYMBALTA) 60 MG capsule TAKE 1 CAPSULE (60 MG TOTAL) BY MOUTH 2 (TWO) TIMES DAILY. (Patient taking differently: TAKE 1 CAPSULE (60 MG TOTAL) BY MOUTH 1 TIME DAILY.) 60 capsule 11  . fluticasone (FLONASE) 50 MCG/ACT nasal spray Place into both nostrils daily.    . Loratadine (CLARITIN PO) Take by mouth.    . mesalamine (APRISO)  0.375 g 24 hr capsule Take 2 capsules (0.75 g total) by mouth daily. 60 capsule 6  . mesalamine (ROWASA) 4 g enema Place 60 mLs (4 g total) rectally at bedtime. 30 Bottle 6  . Multiple Vitamin (MULTIVITAMIN) capsule Take 1 capsule by mouth daily.    . naltrexone (DEPADE) 50 MG tablet TAKE 1 TABLET BY MOUTH EVERY DAY 30 tablet 3  . omeprazole (PRILOSEC) 10 MG capsule Take 10 mg by mouth daily.    . propranolol (INDERAL) 10 MG tablet TAKE 1 TABLET BY MOUTH DAILY AS NEEDED 30 tablet 6  . thiamine (VITAMIN B-1) 100 MG tablet Take 100 mg by mouth daily.    Marland Kitchen VITAMIN D, CHOLECALCIFEROL, PO Take by mouth.     Current Facility-Administered Medications  Medication Dose Route Frequency Provider Last Rate Last Dose  . 0.9 %  sodium chloride infusion  500 mL Intravenous Continuous Milus Banister, MD        Allergies as of 08/15/2016  . (No Known Allergies)    Family History  Problem Relation Age of Onset  . Thyroid cancer Mother   . Breast cancer Maternal Grandmother   . Colon cancer Maternal Grandmother 68    died 2 weeks later  . Prostate cancer Maternal Uncle   . Esophageal cancer Maternal Grandfather     died mid 15's  . Cancer Neg Hx   . Alcohol abuse Neg Hx   . Depression  Neg Hx   . Drug abuse Neg Hx   . Early death Neg Hx   . Stroke Neg Hx   . Kidney disease Neg Hx   . Hypertension Neg Hx   . Hyperlipidemia Neg Hx   . Pancreatic cancer Neg Hx   . Rectal cancer Neg Hx   . Stomach cancer Neg Hx     Social History   Social History  . Marital status: Single    Spouse name: N/A  . Number of children: 0  . Years of education: N/A   Occupational History  . Attorney    Social History Main Topics  . Smoking status: Current Every Day Smoker    Types: Cigars  . Smokeless tobacco: Never Used     Comment: 1 per day  . Alcohol use No     Comment: Alcoholism- remission - Oct 2015  . Drug use: No  . Sexual activity: Not Currently    Partners: Female   Other Topics Concern   . Not on file   Social History Narrative  . No narrative on file     Physical Exam: BP 122/86 (BP Location: Left Arm, Patient Position: Sitting, Cuff Size: Normal)   Pulse 80   Ht 5' 8"  (1.727 m)   Wt 168 lb 6 oz (76.4 kg)   BMI 25.60 kg/m  Constitutional: generally well-appearing Psychiatric: alert and oriented x3 Abdomen: soft, nontender, nondistended, no obvious ascites, no peritoneal signs, normal bowel sounds No peripheral edema noted in lower extremities  Assessment and plan: 31 y.o. male with distal ulcerative colitis  His symptoms are under good control on oral and rectal mesalamine. I'm going to try to taper him off of the enemas to see if he can just be on oral therapy only. I wrote on a long tapering regimen for him. He will call to report on his response in 3 months and I will like to see him back in the office in 6 months for follow-up. He will continue on apriso 2 pills once daily.  Please see the "Patient Instructions" section for addition details about the plan.  Owens Loffler, MD [facility] 08/15/2016, 9:30 AM

## 2016-08-15 NOTE — Patient Instructions (Signed)
Call in 3 months to report on your response to tapering enemas.   Continue apriso 2 pills once daily. Taper off enemas over next 2 months. Every other day for a month, then every third day for a month, then off. New script written for enemas (once daily canasa, 30 disp, 6 refills Please return to see Dr. Ardis Hughs in 6 months.

## 2016-10-04 ENCOUNTER — Other Ambulatory Visit: Payer: Self-pay | Admitting: Internal Medicine

## 2016-10-04 DIAGNOSIS — F411 Generalized anxiety disorder: Secondary | ICD-10-CM

## 2016-11-06 ENCOUNTER — Other Ambulatory Visit: Payer: Self-pay | Admitting: Internal Medicine

## 2016-11-06 DIAGNOSIS — F1011 Alcohol abuse, in remission: Secondary | ICD-10-CM

## 2016-11-07 ENCOUNTER — Telehealth: Payer: Self-pay

## 2016-11-07 DIAGNOSIS — F411 Generalized anxiety disorder: Secondary | ICD-10-CM

## 2016-11-07 MED ORDER — PROPRANOLOL HCL 10 MG PO TABS: 10.0000 mg | ORAL_TABLET | Freq: Every day | ORAL | 0 refills | Status: DC | PRN

## 2016-11-07 NOTE — Telephone Encounter (Signed)
rq to fill propanolol.  Pt will have to schedule an appt first before a refill will be sent.   ([facility] = [address])

## 2016-11-07 NOTE — Telephone Encounter (Signed)
Patient has set up an appointment for Tuesday July 26 @245pm .

## 2016-11-07 NOTE — Telephone Encounter (Signed)
Sent rx.

## 2016-11-12 ENCOUNTER — Other Ambulatory Visit: Payer: Self-pay | Admitting: Gastroenterology

## 2016-11-15 ENCOUNTER — Telehealth: Payer: Self-pay | Admitting: Gastroenterology

## 2016-11-15 NOTE — Telephone Encounter (Signed)
The pt had colon in 12/17.  He was using apriso daily and rowasa enema at bedtime and did well for a few months. Recently tried canasa suppository and that did not work as well as the enema so he went back on the enema and is doing well.  He wants to schedule follow up to discuss.  Canasa daily symptoms returned.  Pt has been given a follow up with Dr Christella HartiganJacobs on 9/14 and told to use the rowasa daily until the appt. He will let his pharmacy know if he needs refills

## 2016-11-17 ENCOUNTER — Encounter: Payer: Self-pay | Admitting: Internal Medicine

## 2016-11-17 ENCOUNTER — Other Ambulatory Visit (INDEPENDENT_AMBULATORY_CARE_PROVIDER_SITE_OTHER): Payer: 59

## 2016-11-17 ENCOUNTER — Ambulatory Visit (INDEPENDENT_AMBULATORY_CARE_PROVIDER_SITE_OTHER): Payer: 59 | Admitting: Internal Medicine

## 2016-11-17 VITALS — BP 130/82 | HR 77 | Temp 98.4°F | Ht 68.0 in | Wt 168.0 lb

## 2016-11-17 DIAGNOSIS — K5190 Ulcerative colitis, unspecified, without complications: Secondary | ICD-10-CM | POA: Insufficient documentation

## 2016-11-17 DIAGNOSIS — Z0000 Encounter for general adult medical examination without abnormal findings: Secondary | ICD-10-CM

## 2016-11-17 DIAGNOSIS — D72829 Elevated white blood cell count, unspecified: Secondary | ICD-10-CM | POA: Diagnosis not present

## 2016-11-17 DIAGNOSIS — K5100 Ulcerative (chronic) pancolitis without complications: Secondary | ICD-10-CM

## 2016-11-17 DIAGNOSIS — K51019 Ulcerative (chronic) pancolitis with unspecified complications: Secondary | ICD-10-CM | POA: Insufficient documentation

## 2016-11-17 LAB — CBC WITH DIFFERENTIAL/PLATELET
BASOS ABS: 0 10*3/uL (ref 0.0–0.1)
Basophils Relative: 0.2 % (ref 0.0–3.0)
EOS PCT: 1.4 % (ref 0.0–5.0)
Eosinophils Absolute: 0.2 10*3/uL (ref 0.0–0.7)
HEMATOCRIT: 41.7 % (ref 39.0–52.0)
HEMOGLOBIN: 14.2 g/dL (ref 13.0–17.0)
LYMPHS PCT: 10.6 % — AB (ref 12.0–46.0)
Lymphs Abs: 1.3 10*3/uL (ref 0.7–4.0)
MCHC: 34 g/dL (ref 30.0–36.0)
MCV: 91 fl (ref 78.0–100.0)
MONOS PCT: 3.7 % (ref 3.0–12.0)
Monocytes Absolute: 0.5 10*3/uL (ref 0.1–1.0)
Neutro Abs: 10.6 10*3/uL — ABNORMAL HIGH (ref 1.4–7.7)
Neutrophils Relative %: 84.1 % — ABNORMAL HIGH (ref 43.0–77.0)
Platelets: 269 10*3/uL (ref 150.0–400.0)
RBC: 4.58 Mil/uL (ref 4.22–5.81)
RDW: 12.4 % (ref 11.5–15.5)
WBC: 12.6 10*3/uL — AB (ref 4.0–10.5)

## 2016-11-17 LAB — LIPID PANEL
CHOLESTEROL: 172 mg/dL (ref 0–200)
HDL: 49.6 mg/dL (ref >39.00)
LDL Cholesterol: 102 mg/dL — ABNORMAL HIGH (ref 0–99)
NonHDL: 122.71
TRIGLYCERIDES: 102 mg/dL (ref 0.0–149.0)
Total CHOL/HDL Ratio: 3
VLDL: 20.4 mg/dL (ref 0.0–40.0)

## 2016-11-17 LAB — COMPREHENSIVE METABOLIC PANEL
ALBUMIN: 4.6 g/dL (ref 3.5–5.2)
ALK PHOS: 43 U/L (ref 39–117)
ALT: 19 U/L (ref 0–53)
AST: 24 U/L (ref 0–37)
BILIRUBIN TOTAL: 0.4 mg/dL (ref 0.2–1.2)
BUN: 10 mg/dL (ref 6–23)
CALCIUM: 9.8 mg/dL (ref 8.4–10.5)
CO2: 29 mEq/L (ref 19–32)
Chloride: 100 mEq/L (ref 96–112)
Creatinine, Ser: 1.08 mg/dL (ref 0.40–1.50)
GFR: 83.57 mL/min (ref >60.00)
Glucose, Bld: 119 mg/dL — ABNORMAL HIGH (ref 70–99)
POTASSIUM: 3.4 meq/L — AB (ref 3.5–5.1)
Sodium: 135 mEq/L (ref 135–145)
TOTAL PROTEIN: 7.7 g/dL (ref 6.0–8.3)

## 2016-11-17 LAB — TSH: TSH: 1.43 u[IU]/mL (ref 0.35–4.50)

## 2016-11-17 NOTE — Progress Notes (Signed)
Subjective:  Patient ID: Blake Chapman, male    DOB: 01-Feb-1986  Age: 31 y.o. MRN: [id number]  CC: Annual Exam   HPI Blake Chapman presents for a CPX - he feels well and offers no complaints.  Outpatient Medications Prior to Visit  Medication Sig Dispense Refill  . APRISO 0.375 g 24 hr capsule TAKE 2 CAPSULES BY MOUTH DAILY 60 capsule 6  . Ascorbic Acid (VITAMIN C) 100 MG tablet Take 100 mg by mouth daily.    . DULoxetine (CYMBALTA) 60 MG capsule TAKE 1 CAPSULE (60 MG TOTAL) BY MOUTH 2 (TWO) TIMES DAILY. (Patient taking differently: TAKE 1 CAPSULE (60 MG TOTAL) BY MOUTH 1 TIME DAILY.) 60 capsule 11  . fluticasone (FLONASE) 50 MCG/ACT nasal spray Place into both nostrils daily.    . Loratadine (CLARITIN PO) Take by mouth.    . mesalamine (ROWASA) 4 g enema Place 60 mLs (4 g total) rectally at bedtime. 30 Bottle 6  . Multiple Vitamin (MULTIVITAMIN) capsule Take 1 capsule by mouth daily.    . naltrexone (DEPADE) 50 MG tablet TAKE 1 TABLET BY MOUTH EVERY DAY 30 tablet 3  . omeprazole (PRILOSEC) 10 MG capsule Take 10 mg by mouth daily.    . propranolol (INDERAL) 10 MG tablet Take 1 tablet (10 mg total) by mouth daily as needed. 30 tablet 0  . thiamine (VITAMIN B-1) 100 MG tablet Take 100 mg by mouth daily.    Marland Kitchen. VITAMIN D, CHOLECALCIFEROL, PO Take by mouth.    . mesalamine (CANASA) 1000 MG suppository Place 1 suppository (1,000 mg total) rectally at bedtime. 30 suppository 6   Facility-Administered Medications Prior to Visit  Medication Dose Route Frequency Provider Last Rate Last Dose  . 0.9 %  sodium chloride infusion  500 mL Intravenous Continuous Rachael FeeJacobs, Daniel P, MD        ROS Review of Systems  Constitutional: Negative for activity change, appetite change, chills, fatigue and fever.  HENT: Negative.  Negative for sore throat and trouble swallowing.   Respiratory: Negative.  Negative for cough, chest tightness and shortness of breath.   Cardiovascular: Negative.  Negative for chest  pain, palpitations and leg swelling.  Gastrointestinal: Positive for anal bleeding. Negative for abdominal pain, blood in stool, diarrhea, nausea and vomiting.       Rare rectal bleeding  Endocrine: Negative.   Genitourinary: Negative.  Negative for difficulty urinating, dysuria, hematuria, penile swelling, scrotal swelling, testicular pain and urgency.  Musculoskeletal: Negative.  Negative for back pain.  Skin: Negative.  Negative for color change and rash.  Allergic/Immunologic: Negative.   Neurological: Negative.  Negative for dizziness, weakness and headaches.  Hematological: Negative for adenopathy. Does not bruise/bleed easily.  Psychiatric/Behavioral: Negative.     Objective:  BP 130/82 (BP Location: Left Arm, Patient Position: Sitting, Cuff Size: Normal)   Pulse 77   Temp 98.4 F (36.9 C) (Oral)   Ht 5\' 8"  (1.727 m)   Wt 168 lb (76.2 kg)   SpO2 99%   BMI 25.54 kg/m   BP Readings from Last 3 Encounters:  11/17/16 130/82  08/15/16 122/86  04/04/16 106/63    Wt Readings from Last 3 Encounters:  11/17/16 168 lb (76.2 kg)  08/15/16 168 lb 6 oz (76.4 kg)  04/04/16 172 lb (78 kg)    Physical Exam  Constitutional: He is oriented to person, place, and time. No distress.  HENT:  Mouth/Throat: Oropharynx is clear and moist. No oropharyngeal exudate.  Eyes: Conjunctivae are normal. Right  eye exhibits no discharge. Left eye exhibits no discharge. No scleral icterus.  Neck: Normal range of motion. Neck supple. No JVD present. No thyromegaly present.  Cardiovascular: Normal rate, regular rhythm and intact distal pulses.  Exam reveals no gallop and no friction rub.   No murmur heard. Pulmonary/Chest: Effort normal and breath sounds normal. No respiratory distress. He has no wheezes. He has no rales. He exhibits no tenderness.  Abdominal: Soft. Bowel sounds are normal. He exhibits no distension and no mass. There is no tenderness. There is no rebound and no guarding.    Musculoskeletal: Normal range of motion. He exhibits no edema, tenderness or deformity.  Lymphadenopathy:    He has no cervical adenopathy.  Neurological: He is alert and oriented to person, place, and time.  Skin: Skin is warm and dry. No rash noted. He is not diaphoretic. No erythema. No pallor.  Vitals reviewed.   Lab Results  Component Value Date   WBC 12.6 (H) 11/17/2016   HGB 14.2 11/17/2016   HCT 41.7 11/17/2016   PLT 269.0 11/17/2016   GLUCOSE 119 (H) 11/17/2016   CHOL 172 11/17/2016   TRIG 102.0 11/17/2016   HDL 49.60 11/17/2016   LDLCALC 102 (H) 11/17/2016   ALT 19 11/17/2016   AST 24 11/17/2016   NA 135 11/17/2016   K 3.4 (L) 11/17/2016   CL 100 11/17/2016   CREATININE 1.08 11/17/2016   BUN 10 11/17/2016   CO2 29 11/17/2016   TSH 1.43 11/17/2016    Patient was never admitted.  Assessment & Plan:   Blake Chapman was seen today for annual exam.  Diagnoses and all orders for this visit:  Routine general medical examination at a health care facility- exam completed, labs reviewed, vaccines reviewed, patient education material was given. -     Lipid panel; Future -     Comprehensive metabolic panel; Future -     CBC with Differential/Platelet; Future -     TSH; Future  Ulcerative pancolitis without complication (HCC)- His symptoms have improved with the new agent  Leukocytosis, unspecified type- this is most likely related to the tobacco abuse since he doesn't  have any symptoms related to this, he is not anemic, the differential only shows a mild increase in neutrophils, and his total protein is normal. I've asked him to return in 6-8 weeks to have this rechecked.   I am having Blake Chapman maintain his multivitamin, Loratadine (CLARITIN PO), fluticasone, (VITAMIN D, CHOLECALCIFEROL, PO), DULoxetine, omeprazole, thiamine, vitamin C, mesalamine, naltrexone, propranolol, and APRISO. We will continue to administer sodium chloride.  No orders of the defined types were  placed in this encounter.    Follow-up: Return if symptoms worsen or fail to improve.  Sanda Lingerhomas Baruc Tugwell, MD

## 2016-11-17 NOTE — Patient Instructions (Signed)

## 2016-11-18 ENCOUNTER — Other Ambulatory Visit: Payer: Self-pay | Admitting: Gastroenterology

## 2016-11-18 ENCOUNTER — Encounter: Payer: Self-pay | Admitting: Internal Medicine

## 2016-11-18 DIAGNOSIS — D72829 Elevated white blood cell count, unspecified: Secondary | ICD-10-CM | POA: Insufficient documentation

## 2016-12-14 ENCOUNTER — Other Ambulatory Visit: Payer: Self-pay | Admitting: Gastroenterology

## 2016-12-20 ENCOUNTER — Telehealth: Payer: Self-pay | Admitting: Gastroenterology

## 2016-12-20 MED ORDER — PREDNISONE 20 MG PO TABS: ORAL_TABLET | ORAL | 0 refills | Status: DC

## 2016-12-20 MED ORDER — MESALAMINE ER 0.375 G PO CP24: 1.5000 g | ORAL_CAPSULE | Freq: Every day | ORAL | 6 refills | Status: DC

## 2016-12-20 NOTE — Telephone Encounter (Signed)
Lets see if a quick steroid taper can help (presnisone 65m for 2 days, then 340mfor 2 days, then 20 for 2 days, then 1055m day for a week and then OFF.  Also I'd like to increase his apriso to 4 pills daily instead of 2 pills daily. He'll probably need a new script for that.  He should continue rowasa enemas nightly.

## 2016-12-20 NOTE — Telephone Encounter (Signed)
Missed 2 days of his enema and oral medication, Rowasa and Apriso about 1 month ago. Not immediatly symptomatic but could tell he did not feel as well.  A week ago he began having urgency, increased number of bowel movements, then finally loose bowel movements. He had blood mixed with the loose stools, but that has become less. He is now having enough urgency and tenesmus he has stayed home from work. He has fatigue and abdominal discomfort.  His appointment is 01/06/17 with you.

## 2016-12-20 NOTE — Telephone Encounter (Signed)
Pt aware and prescription has been sent to the pharmacy.

## 2017-01-03 ENCOUNTER — Telehealth: Payer: Self-pay | Admitting: Gastroenterology

## 2017-01-03 ENCOUNTER — Other Ambulatory Visit: Payer: Self-pay | Admitting: Internal Medicine

## 2017-01-03 DIAGNOSIS — F411 Generalized anxiety disorder: Secondary | ICD-10-CM

## 2017-01-03 MED ORDER — MESALAMINE ER 0.375 G PO CP24: 1.5000 g | ORAL_CAPSULE | Freq: Every day | ORAL | 3 refills | Status: DC

## 2017-01-03 NOTE — Telephone Encounter (Signed)
Prescription sent as requested.

## 2017-01-06 ENCOUNTER — Ambulatory Visit (INDEPENDENT_AMBULATORY_CARE_PROVIDER_SITE_OTHER)
Admission: RE | Admit: 2017-01-06 | Discharge: 2017-01-06 | Disposition: A | Discharge status: Home / Self Care | Payer: 59 | Source: Ambulatory Visit | Attending: Gastroenterology | Admitting: Gastroenterology

## 2017-01-06 ENCOUNTER — Other Ambulatory Visit (INDEPENDENT_AMBULATORY_CARE_PROVIDER_SITE_OTHER): Payer: 59

## 2017-01-06 ENCOUNTER — Ambulatory Visit (INDEPENDENT_AMBULATORY_CARE_PROVIDER_SITE_OTHER): Payer: 59 | Admitting: Gastroenterology

## 2017-01-06 ENCOUNTER — Encounter: Payer: Self-pay | Admitting: Gastroenterology

## 2017-01-06 VITALS — BP 92/70 | HR 100 | Ht 68.0 in | Wt 154.0 lb

## 2017-01-06 DIAGNOSIS — R05 Cough: Secondary | ICD-10-CM

## 2017-01-06 DIAGNOSIS — R059 Cough, unspecified: Secondary | ICD-10-CM

## 2017-01-06 DIAGNOSIS — R197 Diarrhea, unspecified: Secondary | ICD-10-CM

## 2017-01-06 DIAGNOSIS — R0602 Shortness of breath: Secondary | ICD-10-CM

## 2017-01-06 DIAGNOSIS — K5120 Ulcerative (chronic) proctitis without complications: Secondary | ICD-10-CM | POA: Diagnosis not present

## 2017-01-06 LAB — CBC WITH DIFFERENTIAL/PLATELET
BASOS PCT: 0.1 % (ref 0.0–3.0)
Basophils Absolute: 0 10*3/uL (ref 0.0–0.1)
EOS ABS: 0.1 10*3/uL (ref 0.0–0.7)
EOS PCT: 0.7 % (ref 0.0–5.0)
HEMATOCRIT: 34.8 % — AB (ref 39.0–52.0)
HEMOGLOBIN: 11.6 g/dL — AB (ref 13.0–17.0)
LYMPHS PCT: 4.3 % — AB (ref 12.0–46.0)
Lymphs Abs: 0.9 10*3/uL (ref 0.7–4.0)
MCHC: 33.2 g/dL (ref 30.0–36.0)
MCV: 88.5 fl (ref 78.0–100.0)
MONOS PCT: 8.5 % (ref 3.0–12.0)
Monocytes Absolute: 1.7 10*3/uL — ABNORMAL HIGH (ref 0.1–1.0)
Neutro Abs: 17.5 10*3/uL — ABNORMAL HIGH (ref 1.4–7.7)
Neutrophils Relative %: 86.4 % — ABNORMAL HIGH (ref 43.0–77.0)
Platelets: 658 10*3/uL — ABNORMAL HIGH (ref 150.0–400.0)
RBC: 3.93 Mil/uL — ABNORMAL LOW (ref 4.22–5.81)
RDW: 11.9 % (ref 11.5–15.5)

## 2017-01-06 NOTE — Patient Instructions (Addendum)
CXR PA and lateral (stat, call Dr. Ardis Hughs today with results).  You will have labs checked today in the basement lab.  Please head down after you check out with the front desk  (cbc, stool for C. Diff by PCR and by toxin).  Stay on apriso 4 pills once daily.  Stay on rowasa enemas once nightly.  Please return to see Dr. Ardis Hughs on 02/03/17 at 2:45 pm.  Normal BMI (Body Mass Index- based on height and weight) is between 19 and 25. Your BMI today is Body mass index is 23.42 kg/m. Blake Chapman Please consider follow up  regarding your BMI with your Primary Care Provider.

## 2017-01-06 NOTE — Progress Notes (Signed)
Review of pertinent gastrointestinal problems: 1. Distal UC; presented 2017 with intermittent rectal bleeding. Colonoscopy December 2017 Dr. Ardis Hughs found moderate inflammation from rectum up to 25 cm. The rest of the colon was normal. Terminal ileum could not be intubated. Biopsies from the inflamed segment showed chronic active inflammation. Biopsies from non-inflamed appearing segments of colon in the right and left segments were all normal. He was started on mesalamine enema and mesalamine orally.   HPI: This is a very pleasant 31 year old man whom I last saw for 5 months ago here in the office  Chief complaint is left-sided colitis  Has been flaring for about 2-3 months.  This occurred shortly after his last visit. Possibly related to inadvertent change from enema to suppository form of mesalamine on my part  39m prednisone taper over 2 weeks, which ended this past week  3 weeks of 4 pills apriso rather than 2 pills once daily  Doing enema nightly  Lately  He is overall feeling better than he was 3-4 weeks ago but he is still feeling quite under the weather. He has not had a solid stool in several weeks. The bleeding has decreased but he still sees trace amounts of red rectal blood periodically. He is having some urgency.  He is also bothered by cough that is productive very bothersome: Upper chest cold, head cold. He's having some night sweats but is been going on for several weeks.  ROS: complete GI ROS as described in HPI, all other review negative.  Constitutional:  No unintentional weight loss   Past Medical History:  Diagnosis Date  . Allergy   . Anxiety   . Depression   . GERD (gastroesophageal reflux disease)   . Neuromuscular disorder ([place]    acute bil lower legs neuropathy  . Substance abuse    alcohol - previous    Past Surgical History:  Procedure Laterality Date  . clavical - right repair     2010  . HARDWARE REMOVAL  2011  . HERNIA REPAIR  1991     Current Outpatient Prescriptions  Medication Sig Dispense Refill  . albuterol (PROVENTIL HFA;VENTOLIN HFA) 108 (90 Base) MCG/ACT inhaler Inhale 2-3 puffs into the lungs as needed for wheezing or shortness of breath.    . Ascorbic Acid (VITAMIN C) 100 MG tablet Take 100 mg by mouth daily.    . DULoxetine (CYMBALTA) 60 MG capsule TAKE 1 CAPSULE (60 MG TOTAL) BY MOUTH 2 (TWO) TIMES DAILY. (Patient taking differently: TAKE 1 CAPSULE (60 MG TOTAL) BY MOUTH 1 TIME DAILY.) 60 capsule 11  . fluticasone (FLONASE) 50 MCG/ACT nasal spray Place into both nostrils daily.    . Loratadine (CLARITIN PO) Take by mouth.    . mesalamine (APRISO) 0.375 g 24 hr capsule Take 4 capsules (1.5 g total) by mouth daily. 360 capsule 3  . Mesalamine-Cleanser 4 g KIT PLACE 60 MLS (4 G TOTAL) RECTALLY AT BEDTIME. 4 each 6  . Multiple Vitamin (MULTIVITAMIN) capsule Take 1 capsule by mouth daily.    . naltrexone (DEPADE) 50 MG tablet TAKE 1 TABLET BY MOUTH EVERY DAY 30 tablet 3  . omeprazole (PRILOSEC) 10 MG capsule Take 10 mg by mouth daily.    . propranolol (INDERAL) 10 MG tablet TAKE 1 TABLET (10 MG TOTAL) BY MOUTH DAILY AS NEEDED. 30 tablet 2  . thiamine (VITAMIN B-1) 100 MG tablet Take 100 mg by mouth daily.    .Marland KitchenVITAMIN D, CHOLECALCIFEROL, PO Take by mouth.  Current Facility-Administered Medications  Medication Dose Route Frequency Provider Last Rate Last Dose  . 0.9 %  sodium chloride infusion  500 mL Intravenous Continuous Milus Banister, MD        Allergies as of 01/06/2017  . (No Known Allergies)    Family History  Problem Relation Age of Onset  . Thyroid cancer Mother   . Breast cancer Maternal Grandmother   . Colon cancer Maternal Grandmother 2       died 2 weeks later  . Prostate cancer Maternal Uncle   . Esophageal cancer Maternal Grandfather        died mid 19's  . Cancer Neg Hx   . Alcohol abuse Neg Hx   . Depression Neg Hx   . Drug abuse Neg Hx   . Early death Neg Hx   . Stroke Neg  Hx   . Kidney disease Neg Hx   . Hypertension Neg Hx   . Hyperlipidemia Neg Hx   . Pancreatic cancer Neg Hx   . Rectal cancer Neg Hx   . Stomach cancer Neg Hx     Social History   Social History  . Marital status: Single    Spouse name: N/A  . Number of children: 0  . Years of education: N/A   Occupational History  . Attorney    Social History Main Topics  . Smoking status: Current Every Day Smoker    Types: Cigars  . Smokeless tobacco: Never Used     Comment: 1 per day  . Alcohol use No     Comment: Alcoholism- remission - Oct 2015  . Drug use: No  . Sexual activity: Not Currently    Partners: Female   Other Topics Concern  . Not on file   Social History Narrative  . No narrative on file     Physical Exam: BP 92/70 (BP Location: Left Arm, Patient Position: Sitting, Cuff Size: Normal)   Pulse 100   Ht 5' 8"  (1.727 m)   Wt 154 lb (69.9 kg)   BMI 23.42 kg/m  Constitutional: generally well-appearing Psychiatric: alert and oriented x3 Abdomen: soft, nontender, nondistended, no obvious ascites, no peritoneal signs, normal bowel sounds No peripheral edema noted in lower extremities  Assessment and plan: 31 y.o. male with left-sided colitis, upper respiratory tract infection  First I explained to him that I think he was called in the wrong rectal mesalamine formulation. He should be taking enemas not suppositories due to the fact that his inflammation extended about 25-30 cm from his anus. He is going to stay on the MR formulation of the mesalamine nightly for now. He is going to continue 4 pills of present which is full strength oral mesalamine for now as well. Going to check him for Clostridium difficile and also pneumonia with his bad cough. If he has no obvious sign of infection and probably start him on Euceris as well. He will also get a CBC today.  He will follow-up with me in 4 weeks and sooner if any problems. I gave him my cell phone number with instructions  to call any time.  Please see the "Patient Instructions" section for addition details about the plan.  Owens Loffler, MD [facility] 01/06/2017, 9:31 AM

## 2017-01-09 ENCOUNTER — Other Ambulatory Visit: Payer: 59

## 2017-01-09 DIAGNOSIS — R0602 Shortness of breath: Secondary | ICD-10-CM

## 2017-01-09 DIAGNOSIS — R197 Diarrhea, unspecified: Secondary | ICD-10-CM

## 2017-01-09 DIAGNOSIS — R059 Cough, unspecified: Secondary | ICD-10-CM

## 2017-01-09 DIAGNOSIS — R05 Cough: Secondary | ICD-10-CM

## 2017-01-10 LAB — CLOSTRIDIUM DIFFICILE BY PCR: CDIFFPCR: NOT DETECTED

## 2017-01-13 ENCOUNTER — Telehealth: Payer: Self-pay | Admitting: Gastroenterology

## 2017-01-13 MED ORDER — PREDNISONE 10 MG PO TABS: 40.0000 mg | ORAL_TABLET | Freq: Every day | ORAL | 0 refills | Status: DC

## 2017-01-13 NOTE — Telephone Encounter (Signed)
I spoke with him this afternoon. He has noticed slight improvement since starting his vancomycin but overall no real significant changes. He is still having urgency, frequency. Intermittent bleeding. I have empirically put him on vancomycin when his white blood cell count came back at 20,000. His C. difficile by PCR was negative however that was after 3 or 4 days of the antibiotics. I think he either does not have C. difficile or he has C. difficile and it has caused his underlying colitis to flare as well. He is going to continue his vancomycin 2 week course as previously prescribed and I am calling him in prednisone 40 mg he will take 40 mg once daily for the next 3 or 4 weeks and I will start a slow taper. He will call me early next week which is for 5 days from now.

## 2017-01-18 ENCOUNTER — Telehealth: Payer: Self-pay | Admitting: Gastroenterology

## 2017-01-18 MED ORDER — TRAZODONE HCL 50 MG PO TABS: 50.0000 mg | ORAL_TABLET | Freq: Every day | ORAL | 2 refills | Status: DC

## 2017-01-18 NOTE — Telephone Encounter (Signed)
prescription sent as requested.

## 2017-01-18 NOTE — Telephone Encounter (Signed)
Blake Chapman is doing very well on 40 mg prednisone that he started 3 or 4 days ago. He is going to continue on this dose for the next 3-4 weeks. He is due to see me in the office around that time. I will likely start a taper of 5 mg per week from then. Trazodone 50 mg is helping him with some insomnia issues at night and I'm going to call in refills.  [name], please call in trazodone 48m pill, one pill qhs PRN, disp 30 with 2 refills. Thanks

## 2017-01-19 DIAGNOSIS — Z23 Encounter for immunization: Secondary | ICD-10-CM | POA: Diagnosis not present

## 2017-02-03 ENCOUNTER — Other Ambulatory Visit (INDEPENDENT_AMBULATORY_CARE_PROVIDER_SITE_OTHER): Payer: 59

## 2017-02-03 ENCOUNTER — Encounter: Payer: Self-pay | Admitting: Gastroenterology

## 2017-02-03 ENCOUNTER — Ambulatory Visit (INDEPENDENT_AMBULATORY_CARE_PROVIDER_SITE_OTHER): Payer: 59 | Admitting: Gastroenterology

## 2017-02-03 VITALS — BP 110/72 | HR 82

## 2017-02-03 DIAGNOSIS — K5120 Ulcerative (chronic) proctitis without complications: Secondary | ICD-10-CM

## 2017-02-03 LAB — CBC WITH DIFFERENTIAL/PLATELET
Basophils Absolute: 0 10*3/uL (ref 0.0–0.1)
Basophils Relative: 0.1 % (ref 0.0–3.0)
EOS ABS: 0 10*3/uL (ref 0.0–0.7)
Eosinophils Relative: 0.1 % (ref 0.0–5.0)
HEMATOCRIT: 36.1 % — AB (ref 39.0–52.0)
HEMOGLOBIN: 11.7 g/dL — AB (ref 13.0–17.0)
LYMPHS PCT: 6.1 % — AB (ref 12.0–46.0)
Lymphs Abs: 0.8 10*3/uL (ref 0.7–4.0)
MCHC: 32.5 g/dL (ref 30.0–36.0)
MCV: 90.9 fl (ref 78.0–100.0)
MONO ABS: 0.7 10*3/uL (ref 0.1–1.0)
Monocytes Relative: 5.5 % (ref 3.0–12.0)
Neutro Abs: 11.7 10*3/uL — ABNORMAL HIGH (ref 1.4–7.7)
Neutrophils Relative %: 88.2 % — ABNORMAL HIGH (ref 43.0–77.0)
Platelets: 398 10*3/uL (ref 150.0–400.0)
RBC: 3.98 Mil/uL — AB (ref 4.22–5.81)
RDW: 14.8 % (ref 11.5–15.5)

## 2017-02-03 LAB — BASIC METABOLIC PANEL
BUN: 6 mg/dL (ref 6–23)
CHLORIDE: 103 meq/L (ref 96–112)
CO2: 29 meq/L (ref 19–32)
Calcium: 8.3 mg/dL — ABNORMAL LOW (ref 8.4–10.5)
Creatinine, Ser: 0.98 mg/dL (ref 0.40–1.50)
GFR: 93.36 mL/min (ref >60.00)
GLUCOSE: 95 mg/dL (ref 70–99)
POTASSIUM: 3.9 meq/L (ref 3.5–5.1)
SODIUM: 139 meq/L (ref 135–145)

## 2017-02-03 NOTE — Progress Notes (Signed)
Review of pertinent gastrointestinal problems: 1. Distal UC;presented 2017 with intermittent rectal bleeding. Colonoscopy December 2017 Dr. Ardis Hughs found moderate inflammation from rectum up to 25 cm. The rest of the colon was normal. Terminal ileum could not be intubated. Biopsies from the inflamed segment showed chronic active inflammation. Biopsies from non-inflamed appearing segments of colon in the right and left segments were all normal. He was started on mesalamine enema and mesalamine orally.  September 2018 flare, labs showed white blood cell count was 20k and I Put him on vancomycin, C. difficile by PCR was negative however. Eventual steroid pulse 40 mg once daily significantly helped him clinically.    HPI: This is a very pleasant 31 year old man whom I last saw about 2 months ago.  Chief complaint is ulcerative proctitis  Overall he is much much improved since starting the 40 mg daily steroid pulse  4-6 bms per day, soft, mushy still; usually after he eats.  No blood in weeks.  No abd pains except with urgency times.  Very gassy.  Sleeping fairly well.  He has been on prednisone 40 mg a day for about 3 weeks now.  ROS: complete GI ROS as described in HPI, all other review negative.  Constitutional:  No unintentional weight loss   Past Medical History:  Diagnosis Date  . Allergy   . Anxiety   . Depression   . GERD (gastroesophageal reflux disease)   . Neuromuscular disorder ([place])    acute bil lower legs neuropathy  . Substance abuse ([place])    alcohol - previous    Past Surgical History:  Procedure Laterality Date  . clavical - right repair     2010  . HARDWARE REMOVAL  2011  . HERNIA REPAIR  1991    Current Outpatient Prescriptions  Medication Sig Dispense Refill  . albuterol (PROVENTIL HFA;VENTOLIN HFA) 108 (90 Base) MCG/ACT inhaler Inhale 2-3 puffs into the lungs as needed for wheezing or shortness of breath.    . Ascorbic Acid (VITAMIN C) 100 MG tablet  Take 100 mg by mouth daily.    . DULoxetine (CYMBALTA) 60 MG capsule TAKE 1 CAPSULE (60 MG TOTAL) BY MOUTH 2 (TWO) TIMES DAILY. (Patient taking differently: TAKE 1 CAPSULE (60 MG TOTAL) BY MOUTH 1 TIME DAILY.) 60 capsule 11  . fluticasone (FLONASE) 50 MCG/ACT nasal spray Place into both nostrils daily.    . Loratadine (CLARITIN PO) Take by mouth.    . mesalamine (APRISO) 0.375 g 24 hr capsule Take 4 capsules (1.5 g total) by mouth daily. 360 capsule 3  . Mesalamine-Cleanser 4 g KIT PLACE 60 MLS (4 G TOTAL) RECTALLY AT BEDTIME. 4 each 6  . Multiple Vitamin (MULTIVITAMIN) capsule Take 1 capsule by mouth daily.    . naltrexone (DEPADE) 50 MG tablet TAKE 1 TABLET BY MOUTH EVERY DAY 30 tablet 3  . omeprazole (PRILOSEC) 10 MG capsule Take 10 mg by mouth daily.    . predniSONE (DELTASONE) 10 MG tablet Take 4 tablets (40 mg total) by mouth daily at 6 (six) AM. 150 tablet 0  . propranolol (INDERAL) 10 MG tablet TAKE 1 TABLET (10 MG TOTAL) BY MOUTH DAILY AS NEEDED. 30 tablet 2  . thiamine (VITAMIN B-1) 100 MG tablet Take 100 mg by mouth daily.    . traZODone (DESYREL) 50 MG tablet Take 1 tablet (50 mg total) by mouth at bedtime. 30 tablet 2  . VITAMIN D, CHOLECALCIFEROL, PO Take by mouth.     Current Facility-Administered Medications  Medication Dose Route Frequency Provider Last Rate Last Dose  . 0.9 %  sodium chloride infusion  500 mL Intravenous Continuous Milus Banister, MD        Allergies as of 02/03/2017  . (No Known Allergies)    Family History  Problem Relation Age of Onset  . Thyroid cancer Mother   . Breast cancer Maternal Grandmother   . Colon cancer Maternal Grandmother 30       died 2 weeks later  . Prostate cancer Maternal Uncle   . Esophageal cancer Maternal Grandfather        died mid 83's  . Cancer Neg Hx   . Alcohol abuse Neg Hx   . Depression Neg Hx   . Drug abuse Neg Hx   . Early death Neg Hx   . Stroke Neg Hx   . Kidney disease Neg Hx   . Hypertension Neg Hx   .  Hyperlipidemia Neg Hx   . Pancreatic cancer Neg Hx   . Rectal cancer Neg Hx   . Stomach cancer Neg Hx     Social History   Social History  . Marital status: Single    Spouse name: N/A  . Number of children: 0  . Years of education: N/A   Occupational History  . Attorney    Social History Main Topics  . Smoking status: Light Tobacco Smoker    Types: Cigars  . Smokeless tobacco: Never Used     Comment: 1 per day  . Alcohol use No     Comment: Alcoholism- remission - Oct 2015  . Drug use: No  . Sexual activity: Not Currently    Partners: Female   Other Topics Concern  . Not on file   Social History Narrative  . No narrative on file     Physical Exam: BP 110/72   Pulse 82  Constitutional: generally well-appearing Psychiatric: alert and oriented x3 Abdomen: soft, nontender, nondistended, no obvious ascites, no peritoneal signs, normal bowel sounds No peripheral edema noted in lower extremities  Assessment and plan: 31 y.o. male with Ulcerative colitis flare  He is clearly doing better but he is not yet back to normal. He has been on prednisone 40 mg a day for about 2 and half weeks now he's been a stay on it for at least another 2 weeks at this dose. He will call to report on how he is doing at that point. If he is down to near normal stooling and been having slowly taper by about 5 mg per week. He is also start today taking a single Imodium once daily. I'm having him repeat some lab work clinic CBC and basic metabolic profile. His white cell count was 20,002 weeks ago which prompted me to empirically put him on vancomycin.  He is running for state government election in 25 days. He'll return to see me shortly after that about 6 weeks from now.  Please see the "Patient Instructions" section for addition details about the plan.  Owens Loffler, MD [facility] 02/03/2017, 3:00 PM

## 2017-02-03 NOTE — Patient Instructions (Addendum)
Stay on prednisone at 33m daily for now. Call in 2 weeks.  You will have labs checked today in the basement lab.  Please head down after you check out with the front desk  (cbc, bmet).  Start one imodium once daily every morning.  Please return to see Dr. JArdis Hughsin 6 months.  Normal BMI (Body Mass Index- based on height and weight) is between 19 and 25. Your BMI today is There is no height or weight on file to calculate BMI. .Marland KitchenPlease consider follow up  regarding your BMI with your Primary Care Provider.

## 2017-02-16 ENCOUNTER — Other Ambulatory Visit: Payer: Self-pay | Admitting: Gastroenterology

## 2017-02-17 MED ORDER — TRAZODONE HCL 50 MG PO TABS: 50.0000 mg | ORAL_TABLET | Freq: Every day | ORAL | 3 refills | Status: DC

## 2017-02-17 NOTE — Telephone Encounter (Signed)
Refill sent to pharmacy.   

## 2017-02-17 NOTE — Telephone Encounter (Signed)
Can you please refill trazadone 35m pill, take one pill at bedtime PRN, disp 30 with 3 refills.  Thanks

## 2017-02-21 ENCOUNTER — Other Ambulatory Visit: Payer: Self-pay

## 2017-02-21 NOTE — Telephone Encounter (Signed)
error 

## 2017-03-25 ENCOUNTER — Other Ambulatory Visit: Payer: Self-pay | Admitting: Gastroenterology

## 2017-04-07 ENCOUNTER — Ambulatory Visit: Payer: 59 | Admitting: Gastroenterology

## 2017-04-10 ENCOUNTER — Ambulatory Visit (INDEPENDENT_AMBULATORY_CARE_PROVIDER_SITE_OTHER): Payer: 59 | Admitting: Gastroenterology

## 2017-04-10 ENCOUNTER — Encounter: Payer: Self-pay | Admitting: Gastroenterology

## 2017-04-10 VITALS — BP 118/68 | HR 72 | Ht 69.0 in | Wt 170.5 lb

## 2017-04-10 DIAGNOSIS — K5120 Ulcerative (chronic) proctitis without complications: Secondary | ICD-10-CM

## 2017-04-10 NOTE — Patient Instructions (Signed)
Please return to see Dr. Ardis Hughs in 3 months  Continue tapering steroids per our plan. Call if any issues.

## 2017-04-10 NOTE — Progress Notes (Signed)
Review of pertinent gastrointestinal problems: 1. Distal UC;presented 2017 with intermittent rectal bleeding. Colonoscopy December 2017 Dr. Ardis Hughs found moderate inflammation from rectum up to 25 cm. The rest of the colon was normal. Terminal ileum could not be intubated. Biopsies from the inflamed segment showed chronic active inflammation. Biopsies from non-inflamed appearing segments of colon in the right and left segments were all normal. He was started on mesalamine enema and mesalamine orally.  September 2018 flare, labs showed white blood cell count was 20k and I Put him on vancomycin, C. difficile by PCR was negative however. Eventual steroid pulse 40 mg once daily significantly helped him clinically.  HPI: This is a very pleasant 31 year old man whom I last saw about a month and a half ago, 2 months ago.  We elected for him to go on a long slow tapering regimen from his prednisone in part due to his upcoming [place] [employer] election campaign.  Today is last day at 55m prednisone.   Should be off pred by mid January.  Bowels are much much better than previous  Usually solid BMs. occoasionally soft.  No diarrhea, no bleeding  Urgency is gone. + gastrocolic reflex.  Taking two imodium every morning shortly after waking  Drinking more caffeine as he tapers.  Chief complaint is ulcerative colitis  ROS: complete GI ROS as described in HPI, all other review negative.  Constitutional:  No unintentional weight loss   Past Medical History:  Diagnosis Date  . Allergy   . Anxiety   . Depression   . GERD (gastroesophageal reflux disease)   . [place]romuscular disorder ([place]    acute bil lower legs neuropathy  . Substance abuse ([place]    alcohol - previous    Past Surgical History:  Procedure Laterality Date  . clavical - right repair     2010  . HARDWARE REMOVAL  2011  . HERNIA REPAIR  1991    Current Outpatient Medications  Medication Sig Dispense Refill  . albuterol  (PROVENTIL HFA;VENTOLIN HFA) 108 (90 Base) MCG/ACT inhaler Inhale 2-3 puffs into the lungs as needed for wheezing or shortness of breath.    . Ascorbic Acid (VITAMIN C) 100 MG tablet Take 100 mg by mouth daily.    . DULoxetine (CYMBALTA) 60 MG capsule TAKE 1 CAPSULE (60 MG TOTAL) BY MOUTH 2 (TWO) TIMES DAILY. (Patient taking differently: TAKE 1 CAPSULE (60 MG TOTAL) BY MOUTH 1 TIME DAILY.) 60 capsule 11  . fluticasone (FLONASE) 50 MCG/ACT nasal spray Place into both nostrils daily.    . Loperamide HCl (IMODIUM PO) Take by mouth 2 (two) times daily.    . Loratadine (CLARITIN PO) Take by mouth.    . Mesalamine-Cleanser 4 g KIT PLACE 60 MLS (4 G TOTAL) RECTALLY AT BEDTIME. 4 each 6  . Multiple Vitamin (MULTIVITAMIN) capsule Take 1 capsule by mouth daily.    . naltrexone (DEPADE) 50 MG tablet TAKE 1 TABLET BY MOUTH EVERY DAY 30 tablet 3  . omeprazole (PRILOSEC) 10 MG capsule Take 10 mg by mouth daily.    . predniSONE (DELTASONE) 10 MG tablet TAKE 4 TABLETS BY MOUTH DAILY AT 6AM 150 tablet 0  . propranolol (INDERAL) 10 MG tablet TAKE 1 TABLET (10 MG TOTAL) BY MOUTH DAILY AS NEEDED. 30 tablet 2  . thiamine (VITAMIN B-1) 100 MG tablet Take 100 mg by mouth daily.    . traZODone (DESYREL) 50 MG tablet Take 1 tablet (50 mg total) by mouth at bedtime. 30 tablet 3  .  VITAMIN D, CHOLECALCIFEROL, PO Take by mouth.    . mesalamine (APRISO) 0.375 g 24 hr capsule Take 4 capsules (1.5 g total) by mouth daily. 360 capsule 3   Current Facility-Administered Medications  Medication Dose Route Frequency Provider Last Rate Last Dose  . 0.9 %  sodium chloride infusion  500 mL Intravenous Continuous Milus Banister, MD        Allergies as of 04/10/2017  . (No Known Allergies)    Family History  Problem Relation Age of Onset  . Thyroid cancer Mother   . Breast cancer Maternal Grandmother   . Colon cancer Maternal Grandmother 38       died 2 weeks later  . Prostate cancer Maternal Uncle   . Esophageal cancer  Maternal Grandfather        died mid 50's  . Cancer Neg Hx   . Alcohol abuse Neg Hx   . Depression Neg Hx   . Drug abuse Neg Hx   . Early death Neg Hx   . Stroke Neg Hx   . Kidney disease Neg Hx   . Hypertension Neg Hx   . Hyperlipidemia Neg Hx   . Pancreatic cancer Neg Hx   . Rectal cancer Neg Hx   . Stomach cancer Neg Hx     Social History   Socioeconomic History  . Marital status: Single    Spouse name: Not on file  . Number of children: 0  . Years of education: Not on file  . Highest education level: Not on file  Social Needs  . Financial resource strain: Not on file  . Food insecurity - worry: Not on file  . Food insecurity - inability: Not on file  . Transportation needs - medical: Not on file  . Transportation needs - non-medical: Not on file  Occupational History  . Occupation: Attorney  Tobacco Use  . Smoking status: Light Tobacco Smoker    Types: Cigars  . Smokeless tobacco: Never Used  . Tobacco comment: 1 per day  Substance and Sexual Activity  . Alcohol use: No    Alcohol/week: 0.0 oz    Comment: Alcoholism- remission - Oct 2015  . Drug use: No  . Sexual activity: Not Currently    Partners: Female  Other Topics Concern  . Not on file  Social History Narrative  . Not on file     Physical Exam: Ht _0  (1.753 m)   Wt 170 lb 8 oz (77.3 kg)   BMI 25.18 kg/m  Constitutional: generally well-appearing Psychiatric: alert and oriented x3 Abdomen: soft, nontender, nondistended, no obvious ascites, no peritoneal signs, normal bowel sounds No peripheral edema noted in lower extremities  Assessment and plan: 31 y.o. male with left-sided ulcerative colitis  He is doing well on the long tapering regimen of prednisone.  He is due to be off of the medicine within about 4 weeks.  Currently he is at 20 mg a day.  He will continue the tapering regimen he has clearly responded well.  He will return to see me in 3 months and sooner if any troubles.  He will  continue on mesalamine full-strength orally and nightly enemas.  He knows if he cannot make a taper then we will be discussing ramping up his therapy.  We had a brief discussion today about immunomodulators versus Biologics.  Please see the "Patient Instructions" section for addition details about the plan.  Owens Loffler, MD [facility] 04/10/2017, 8:53 AM

## 2017-05-01 ENCOUNTER — Other Ambulatory Visit: Payer: Self-pay | Admitting: Gastroenterology

## 2017-06-05 ENCOUNTER — Other Ambulatory Visit: Payer: Self-pay | Admitting: Gastroenterology

## 2017-07-05 ENCOUNTER — Other Ambulatory Visit: Payer: Self-pay | Admitting: Gastroenterology

## 2017-07-12 ENCOUNTER — Other Ambulatory Visit: Payer: Self-pay | Admitting: Gastroenterology

## 2017-08-28 ENCOUNTER — Ambulatory Visit (INDEPENDENT_AMBULATORY_CARE_PROVIDER_SITE_OTHER): Payer: 59 | Admitting: Gastroenterology

## 2017-08-28 ENCOUNTER — Encounter (INDEPENDENT_AMBULATORY_CARE_PROVIDER_SITE_OTHER): Payer: Self-pay

## 2017-08-28 ENCOUNTER — Encounter: Payer: Self-pay | Admitting: Gastroenterology

## 2017-08-28 VITALS — BP 116/78 | HR 70 | Ht 68.0 in | Wt 176.1 lb

## 2017-08-28 DIAGNOSIS — K5130 Ulcerative (chronic) rectosigmoiditis without complications: Secondary | ICD-10-CM | POA: Diagnosis not present

## 2017-08-28 NOTE — Progress Notes (Signed)
Review of pertinent gastrointestinal problems: 1. Distal UC;presented 2017 with intermittent rectal bleeding. Colonoscopy December 2017 Dr. Ardis Hughs found moderate inflammation from rectum up to 25 cm. The rest of the colon was normal. Terminal ileum could not be intubated. Biopsies from the inflamed segment showed chronic active inflammation. Biopsies from non-inflamed appearing segments of colon in the right and left segments were all normal. He was started on mesalamine enema and mesalamine orally.September 2018 flare, labs showed white blood cell count was 20kand I Put him on vancomycin,C. difficile by PCR was negative however. Eventual steroid pulse 40 mg once daily significantly helped him clinically.  HPI: This is a very pleasant 32 year old man whom I last saw for 5 months ago.  He has been on mesalamine enema nightly as well as oral mesalamine 4 pills full-strength once daily.  Chief complaint is distal ulcerative colitis   No bleeding.  Will have 1-2 small stools after a normal BM; not particularly diarrhea.  Still has urgency (10 min after eating).  Overall pretty manageable.   3-6 BMs daily total  Before UC he would go 1-2 times daily.  4-6 cups coffee daily. A few sodas throughout the day (4-5).  Still takes one imodium daily.  Sometimes another throughout the day.  ROS: complete GI ROS as described in HPI, all other review negative.  Constitutional:  No unintentional weight loss   Past Medical History:  Diagnosis Date  . Allergy   . Anxiety   . Depression   . GERD (gastroesophageal reflux disease)   . Neuromuscular disorder ([place])    acute bil lower legs neuropathy  . Substance abuse ([place])    alcohol - previous    Past Surgical History:  Procedure Laterality Date  . clavical - right repair     2010  . HARDWARE REMOVAL  2011  . HERNIA REPAIR  1991    Current Outpatient Medications  Medication Sig Dispense Refill  . albuterol (PROVENTIL HFA;VENTOLIN HFA) 108  (90 Base) MCG/ACT inhaler Inhale 2-3 puffs into the lungs as needed for wheezing or shortness of breath.    . Ascorbic Acid (VITAMIN C) 100 MG tablet Take 100 mg by mouth daily.    . DULoxetine (CYMBALTA) 60 MG capsule TAKE 1 CAPSULE (60 MG TOTAL) BY MOUTH 2 (TWO) TIMES DAILY. (Patient taking differently: daily. TAKE 1 CAPSULE (60 MG TOTAL) BY MOUTH 1 ONCE) TIMES DAILY.) 60 capsule 11  . fluticasone (FLONASE) 50 MCG/ACT nasal spray Place into both nostrils daily.    . Loperamide HCl (IMODIUM PO) Take by mouth 2 (two) times daily.    . Loratadine (CLARITIN PO) Take by mouth.    . Mesalamine-Cleanser 4 g KIT PLACE 60 MLS (4 G TOTAL) RECTALLY AT BEDTIME. 4 kit 6  . Multiple Vitamin (MULTIVITAMIN) capsule Take 1 capsule by mouth daily.    Marland Kitchen omeprazole (PRILOSEC) 10 MG capsule Take 10 mg by mouth daily.    . propranolol (INDERAL) 10 MG tablet TAKE 1 TABLET (10 MG TOTAL) BY MOUTH DAILY AS NEEDED. 30 tablet 2  . thiamine (VITAMIN B-1) 100 MG tablet Take 100 mg by mouth daily.    . traZODone (DESYREL) 50 MG tablet TAKE 1 TABLET BY MOUTH EVERY DAY AT BEDTIME (Patient taking differently: TAKE 1 TABLET BY MOUTH AS NEEDED AT BEDTIME) 30 tablet 0  . VITAMIN D, CHOLECALCIFEROL, PO Take by mouth.    . mesalamine (APRISO) 0.375 g 24 hr capsule Take 4 capsules (1.5 g total) by mouth daily. 360 capsule  3   Current Facility-Administered Medications  Medication Dose Route Frequency Provider Last Rate Last Dose  . 0.9 %  sodium chloride infusion  500 mL Intravenous Continuous Milus Banister, MD        Allergies as of 08/28/2017  . (No Known Allergies)    Family History  Problem Relation Age of Onset  . Thyroid cancer Mother   . Breast cancer Maternal Grandmother   . Colon cancer Maternal Grandmother 89       died 2 weeks later  . Prostate cancer Maternal Uncle   . Esophageal cancer Maternal Grandfather        died mid 16's  . Cancer Neg Hx   . Alcohol abuse Neg Hx   . Depression Neg Hx   . Drug  abuse Neg Hx   . Early death Neg Hx   . Stroke Neg Hx   . Kidney disease Neg Hx   . Hypertension Neg Hx   . Hyperlipidemia Neg Hx   . Pancreatic cancer Neg Hx   . Rectal cancer Neg Hx   . Stomach cancer Neg Hx     Social History   Socioeconomic History  . Marital status: Single    Spouse name: Not on file  . Number of children: 0  . Years of education: Not on file  . Highest education level: Not on file  Occupational History  . Occupation: Occupational hygienist  . Financial resource strain: Not on file  . Food insecurity:    Worry: Not on file    Inability: Not on file  . Transportation needs:    Medical: Not on file    Non-medical: Not on file  Tobacco Use  . Smoking status: Light Tobacco Smoker    Types: Cigars  . Smokeless tobacco: Never Used  . Tobacco comment: 1 per day  Substance and Sexual Activity  . Alcohol use: No    Alcohol/week: 0.0 oz    Comment: Alcoholism- remission - Oct 2015  . Drug use: No  . Sexual activity: Not Currently    Partners: Female  Lifestyle  . Physical activity:    Days per week: Not on file    Minutes per session: Not on file  . Stress: Not on file  Relationships  . Social connections:    Talks on phone: Not on file    Gets together: Not on file    Attends religious service: Not on file    Active member of club or organization: Not on file    Attends meetings of clubs or organizations: Not on file    Relationship status: Not on file  . Intimate partner violence:    Fear of current or ex partner: Not on file    Emotionally abused: Not on file    Physically abused: Not on file    Forced sexual activity: Not on file  Other Topics Concern  . Not on file  Social History Narrative  . Not on file     Physical Exam: BP 116/78   Pulse 70   Ht 5' 8" (1.727 m)   Wt 176 lb 2 oz (79.9 kg)   BMI 26.78 kg/m  Constitutional: generally well-appearing Psychiatric: alert and oriented x3 Abdomen: soft, nontender, nondistended, no  obvious ascites, no peritoneal signs, normal bowel sounds No peripheral edema noted in lower extremities  Assessment and plan: 32 y.o. male with distal ulcerative colitis  He still has 3-6 stools a day and is bothered by  some urgency.  He has the plan some social outings around his bowels.  I am struck by the fact that he drinks at least 8 caffeinated beverages a day and this could certainly be contributing to some of his bowel issues.  I asked him to cut back by at least half of his caffeine intake and he will call to report on his response in 3 to 4 weeks.  If he has not noticed significant improvement in my plan will be to repeat colonoscopy to restage his disease endoscopically.  If he still has active, significant disease on his current therapy then we would have to consider immunomodulators versus Biologics.  We had a brief discussion about those 2 classes today.  Got specific Biologics seem to be an initial preference of his.  Please see the "Patient Instructions" section for addition details about the plan.  Owens Loffler, MD [facility] 08/28/2017, 10:05 AM

## 2017-08-28 NOTE — Patient Instructions (Addendum)
Cut back on your caffeine intake and call in 3-4 weeks to report on your response.  Normal BMI (Body Mass Index- based on height and weight) is between 19 and 25. Your BMI today is Body mass index is 26.78 kg/m. Marland Kitchen Please consider follow up  regarding your BMI with your Primary Care Provider.

## 2017-10-18 ENCOUNTER — Other Ambulatory Visit: Payer: Self-pay | Admitting: Internal Medicine

## 2017-10-18 ENCOUNTER — Telehealth: Payer: Self-pay | Admitting: Internal Medicine

## 2017-10-18 DIAGNOSIS — F411 Generalized anxiety disorder: Secondary | ICD-10-CM

## 2017-10-18 MED ORDER — DULOXETINE HCL 60 MG PO CPEP: 60.0000 mg | ORAL_CAPSULE | Freq: Two times a day (BID) | ORAL | 1 refills | Status: DC

## 2017-10-18 NOTE — Telephone Encounter (Signed)
Erx has been resent. Left detailed message for pharmacy that erx has been resent and to call with any questions.

## 2017-10-18 NOTE — Telephone Encounter (Signed)
Copied from [place] [id number]. Topic: Quick Communication - See Telephone Encounter >> Oct 18, 2017  1:31 PM Judyann Munson wrote: CRM for notification. See Telephone encounter for: 10/18/17. [facility] called and stated the medication DULoxetine (CYMBALTA) 60 MG that was sent over has two sets of instruction and they are needing clarification

## 2017-11-21 ENCOUNTER — Encounter: Payer: 59 | Admitting: Internal Medicine

## 2017-11-29 ENCOUNTER — Encounter: Payer: Self-pay | Admitting: Internal Medicine

## 2017-11-29 ENCOUNTER — Ambulatory Visit (INDEPENDENT_AMBULATORY_CARE_PROVIDER_SITE_OTHER): Payer: 59 | Admitting: Internal Medicine

## 2017-11-29 ENCOUNTER — Other Ambulatory Visit (INDEPENDENT_AMBULATORY_CARE_PROVIDER_SITE_OTHER): Payer: 59

## 2017-11-29 VITALS — BP 110/78 | HR 71 | Temp 98.2°F | Ht 68.0 in | Wt 177.5 lb

## 2017-11-29 DIAGNOSIS — D539 Nutritional anemia, unspecified: Secondary | ICD-10-CM

## 2017-11-29 DIAGNOSIS — Z0000 Encounter for general adult medical examination without abnormal findings: Secondary | ICD-10-CM

## 2017-11-29 DIAGNOSIS — N522 Drug-induced erectile dysfunction: Secondary | ICD-10-CM | POA: Diagnosis not present

## 2017-11-29 LAB — CBC WITH DIFFERENTIAL/PLATELET
BASOS PCT: 0.2 % (ref 0.0–3.0)
Basophils Absolute: 0 10*3/uL (ref 0.0–0.1)
EOS PCT: 1.9 % (ref 0.0–5.0)
Eosinophils Absolute: 0.3 10*3/uL (ref 0.0–0.7)
HEMATOCRIT: 40.1 % (ref 39.0–52.0)
Hemoglobin: 13.9 g/dL (ref 13.0–17.0)
LYMPHS PCT: 8.7 % — AB (ref 12.0–46.0)
Lymphs Abs: 1.2 10*3/uL (ref 0.7–4.0)
MCHC: 34.7 g/dL (ref 30.0–36.0)
MCV: 88.6 fl (ref 78.0–100.0)
MONOS PCT: 4.6 % (ref 3.0–12.0)
Monocytes Absolute: 0.6 10*3/uL (ref 0.1–1.0)
NEUTROS ABS: 11.6 10*3/uL — AB (ref 1.4–7.7)
Neutrophils Relative %: 84.6 % — ABNORMAL HIGH (ref 43.0–77.0)
PLATELETS: 284 10*3/uL (ref 150.0–400.0)
RBC: 4.52 Mil/uL (ref 4.22–5.81)
RDW: 12.6 % (ref 11.5–15.5)
WBC: 13.7 10*3/uL — ABNORMAL HIGH (ref 4.0–10.5)

## 2017-11-29 LAB — IBC PANEL
Iron: 125 ug/dL (ref 42–165)
Saturation Ratios: 39 % (ref 20.0–50.0)
Transferrin: 229 mg/dL (ref 212.0–360.0)

## 2017-11-29 LAB — LIPID PANEL
CHOL/HDL RATIO: 3
Cholesterol: 180 mg/dL (ref 0–200)
HDL: 54.2 mg/dL (ref >39.00)
LDL CALC: 105 mg/dL — AB (ref 0–99)
NonHDL: 125.86
Triglycerides: 102 mg/dL (ref 0.0–149.0)
VLDL: 20.4 mg/dL (ref 0.0–40.0)

## 2017-11-29 LAB — FOLATE

## 2017-11-29 LAB — VITAMIN B12: Vitamin B-12: 514 pg/mL (ref 211–911)

## 2017-11-29 LAB — FERRITIN: Ferritin: 26.5 ng/mL (ref 22.0–322.0)

## 2017-11-29 MED ORDER — AVANAFIL 200 MG PO TABS: 1.0000 | ORAL_TABLET | Freq: Every day | ORAL | 5 refills | Status: DC | PRN

## 2017-11-29 NOTE — Progress Notes (Signed)
Subjective:  Patient ID: Blake Chapman, male    DOB: Sep 27, 1985  Age: 31 y.o. MRN: [id number]  CC: Annual Exam   HPI Blake Chapman presents for a CPX.  He complains of ED, his libido is good though.  Outpatient Medications Prior to Visit  Medication Sig Dispense Refill  . albuterol (PROVENTIL HFA;VENTOLIN HFA) 108 (90 Base) MCG/ACT inhaler Inhale 2-3 puffs into the lungs as needed for wheezing or shortness of breath.    . Ascorbic Acid (VITAMIN C) 100 MG tablet Take 100 mg by mouth daily.    . DULoxetine (CYMBALTA) 60 MG capsule Take 1 capsule (60 mg total) by mouth 2 (two) times daily. 180 capsule 1  . fluticasone (FLONASE) 50 MCG/ACT nasal spray Place into both nostrils daily.    . Loperamide HCl (IMODIUM PO) Take by mouth 2 (two) times daily.    . Loratadine (CLARITIN PO) Take by mouth.    . Mesalamine-Cleanser 4 g KIT PLACE 60 MLS (4 G TOTAL) RECTALLY AT BEDTIME. 4 kit 6  . Multiple Vitamin (MULTIVITAMIN) capsule Take 1 capsule by mouth daily.    Marland Kitchen omeprazole (PRILOSEC) 10 MG capsule Take 10 mg by mouth daily.    . propranolol (INDERAL) 10 MG tablet TAKE 1 TABLET (10 MG TOTAL) BY MOUTH DAILY AS NEEDED. 30 tablet 2  . thiamine (VITAMIN B-1) 100 MG tablet Take 100 mg by mouth daily.    . traZODone (DESYREL) 50 MG tablet TAKE 1 TABLET BY MOUTH EVERY DAY AT BEDTIME (Patient taking differently: TAKE 1 TABLET BY MOUTH AS NEEDED AT BEDTIME) 30 tablet 0  . VITAMIN D, CHOLECALCIFEROL, PO Take by mouth.    . mesalamine (APRISO) 0.375 g 24 hr capsule Take 4 capsules (1.5 g total) by mouth daily. 360 capsule 3   Facility-Administered Medications Prior to Visit  Medication Dose Route Frequency Provider Last Rate Last Dose  . 0.9 %  sodium chloride infusion  500 mL Intravenous Continuous Milus Banister, MD        ROS Review of Systems  Constitutional: Negative for chills, diaphoresis and fatigue.  HENT: Negative.   Eyes: Negative for visual disturbance.  Respiratory: Negative for cough,  chest tightness and shortness of breath.   Cardiovascular: Negative for chest pain, palpitations and leg swelling.  Gastrointestinal: Negative for abdominal pain, anal bleeding, blood in stool, constipation, diarrhea, nausea and vomiting.  Endocrine: Negative for cold intolerance.  Genitourinary: Negative.  Negative for difficulty urinating, penile pain, penile swelling, scrotal swelling, testicular pain and urgency.       + ED  Musculoskeletal: Negative.  Negative for arthralgias and myalgias.  Skin: Negative.  Negative for color change and pallor.  Neurological: Negative.  Negative for dizziness, weakness and light-headedness.  Hematological: Negative for adenopathy. Does not bruise/bleed easily.  Psychiatric/Behavioral: Negative.     Objective:  BP 110/78 (BP Location: Right Arm, Patient Position: Sitting, Cuff Size: Large)   Pulse 71   Temp 98.2 F (36.8 C) (Oral)   Ht 5' 8"  (1.727 m)   Wt 177 lb 8 oz (80.5 kg)   SpO2 97%   BMI 26.99 kg/m   BP Readings from Last 3 Encounters:  11/29/17 110/78  08/28/17 116/78  04/10/17 118/68    Wt Readings from Last 3 Encounters:  11/29/17 177 lb 8 oz (80.5 kg)  08/28/17 176 lb 2 oz (79.9 kg)  04/10/17 170 lb 8 oz (77.3 kg)    Physical Exam  Constitutional: He is oriented to person, place, and  time. No distress.  HENT:  Mouth/Throat: Oropharynx is clear and moist. No oropharyngeal exudate.  Eyes: Conjunctivae are normal. No scleral icterus.  Neck: Normal range of motion. Neck supple. No JVD present. No thyromegaly present.  Cardiovascular: Normal rate, regular rhythm and normal heart sounds.  Pulmonary/Chest: Effort normal and breath sounds normal. No stridor. He has no wheezes. He has no rales.  Abdominal: Soft. Normal appearance and bowel sounds are normal. There is no hepatosplenomegaly. There is no tenderness. No hernia.  Musculoskeletal: Normal range of motion. He exhibits no edema, tenderness or deformity.  Lymphadenopathy:     He has no cervical adenopathy.  Neurological: He is alert and oriented to person, place, and time.  Skin: Skin is warm and dry. No rash noted. He is not diaphoretic.  Psychiatric: He has a normal mood and affect. His behavior is normal. Judgment and thought content normal.  Vitals reviewed.   Lab Results  Component Value Date   WBC 13.7 (H) 11/29/2017   HGB 13.9 11/29/2017   HCT 40.1 11/29/2017   PLT 284.0 11/29/2017   GLUCOSE 95 02/03/2017   CHOL 180 11/29/2017   TRIG 102.0 11/29/2017   HDL 54.20 11/29/2017   LDLCALC 105 (H) 11/29/2017   ALT 19 11/17/2016   AST 24 11/17/2016   NA 139 02/03/2017   K 3.9 02/03/2017   CL 103 02/03/2017   CREATININE 0.98 02/03/2017   BUN 6 02/03/2017   CO2 29 02/03/2017   TSH 1.43 11/17/2016    Dg Chest 2 View  Result Date: 01/06/2017 CLINICAL DATA:  Dry cough, shortness of breath and pain across chest for 2-3 days, occasional cigar smoker, history GERD EXAM: CHEST  2 VIEW COMPARISON:  None FINDINGS: Normal heart size, mediastinal contours, and pulmonary vascularity. Peribronchial thickening and minimal hyperinflation. Lungs clear. No pulmonary infiltrate, pleural effusion or pneumothorax. Biconvex thoracolumbar scoliosis. IMPRESSION: Bronchitic changes without infiltrate. Electronically Signed   By: Lavonia Dana M.D.   On: 01/06/2017 10:19    Assessment & Plan:   Dimarco was seen today for annual exam.  Diagnoses and all orders for this visit:  Routine general medical examination at a health care facility- Exam completed, labs reviewed, vaccines reviewed and updated, patient education material was given. -     Lipid panel; Future -     HIV antibody; Future  Deficiency anemia- His H and H are normal now. I will screen him for vitamin deficiencies. -     CBC with Differential/Platelet; Future -     IBC panel; Future -     Vitamin B12; Future -     Ferritin; Future -     Vitamin B1; Future -     Folate; Future  Drug-induced erectile  dysfunction- Will try a PDE-4 inh -     Avanafil (STENDRA) 200 MG TABS; Take 1 tablet by mouth daily as needed.   I am having Blake Chapman start on Avanafil. I am also having him maintain his multivitamin, Loratadine (CLARITIN PO), fluticasone, (VITAMIN D, CHOLECALCIFEROL, PO), omeprazole, thiamine, vitamin C, propranolol, mesalamine, albuterol, Loperamide HCl (IMODIUM PO), traZODone, Mesalamine-Cleanser, and DULoxetine. We will continue to administer sodium chloride.  Meds ordered this encounter  Medications  . Avanafil (STENDRA) 200 MG TABS    Sig: Take 1 tablet by mouth daily as needed.    Dispense:  10 tablet    Refill:  5     Follow-up: Return if symptoms worsen or fail to improve.  Scarlette Calico, MD

## 2017-11-29 NOTE — Patient Instructions (Signed)

## 2017-12-03 ENCOUNTER — Encounter: Payer: Self-pay | Admitting: Internal Medicine

## 2017-12-03 LAB — HIV ANTIBODY (ROUTINE TESTING W REFLEX): HIV 1&2 Ab, 4th Generation: NONREACTIVE

## 2017-12-03 LAB — VITAMIN B1: VITAMIN B1 (THIAMINE): 45 nmol/L — AB (ref 8–30)

## 2017-12-11 ENCOUNTER — Telehealth: Payer: Self-pay | Admitting: Gastroenterology

## 2017-12-11 NOTE — Telephone Encounter (Signed)
While traveling last week began to have bright red blood in stool, urgency had prednisone 40 mg and began taking it 4 days ago and mesalamine enema 4 g at bedtime nightly instead of every other night.  Has had much improvement since starting prednisone and mesalamine enema.  Do you want to have him taper prednisone, come in for office visit, etc?

## 2017-12-12 NOTE — Telephone Encounter (Signed)
Start tapering the prednisone over two weeks (by 59m every other day).  Continue the enemas nightly. Can you make sure he is still on apriso 4 pills daily.  ROv with me or extender in 3-4 weeks.  Call if he starts doing poorly again.  thanks

## 2017-12-12 NOTE — Telephone Encounter (Signed)
The pt was advised to taper prednisone, however, the pt complains of abdominal pain and cramping over night that woke him about every hour.  He was given an appt on 8/23 to see Dr Christella HartiganJacobs.

## 2017-12-15 ENCOUNTER — Other Ambulatory Visit (INDEPENDENT_AMBULATORY_CARE_PROVIDER_SITE_OTHER): Payer: 59

## 2017-12-15 ENCOUNTER — Encounter (INDEPENDENT_AMBULATORY_CARE_PROVIDER_SITE_OTHER): Payer: Self-pay

## 2017-12-15 ENCOUNTER — Encounter: Payer: Self-pay | Admitting: Gastroenterology

## 2017-12-15 ENCOUNTER — Ambulatory Visit (INDEPENDENT_AMBULATORY_CARE_PROVIDER_SITE_OTHER): Payer: 59 | Admitting: Gastroenterology

## 2017-12-15 ENCOUNTER — Telehealth: Payer: Self-pay

## 2017-12-15 VITALS — BP 110/70 | HR 117 | Ht 68.0 in | Wt 173.0 lb

## 2017-12-15 DIAGNOSIS — K5130 Ulcerative (chronic) rectosigmoiditis without complications: Secondary | ICD-10-CM

## 2017-12-15 DIAGNOSIS — K5120 Ulcerative (chronic) proctitis without complications: Secondary | ICD-10-CM

## 2017-12-15 DIAGNOSIS — Z23 Encounter for immunization: Secondary | ICD-10-CM

## 2017-12-15 LAB — COMPREHENSIVE METABOLIC PANEL
ALT: 67 U/L — ABNORMAL HIGH (ref 0–53)
AST: 44 U/L — ABNORMAL HIGH (ref 0–37)
Albumin: 3.6 g/dL (ref 3.5–5.2)
Alkaline Phosphatase: 89 U/L (ref 39–117)
BUN: 7 mg/dL (ref 6–23)
CO2: 30 mEq/L (ref 19–32)
Calcium: 9.1 mg/dL (ref 8.4–10.5)
Chloride: 98 mEq/L (ref 96–112)
Creatinine, Ser: 0.82 mg/dL (ref 0.40–1.50)
GFR: 114.1 mL/min (ref >60.00)
Glucose, Bld: 113 mg/dL — ABNORMAL HIGH (ref 70–99)
Potassium: 3.6 mEq/L (ref 3.5–5.1)
Sodium: 136 mEq/L (ref 135–145)
Total Bilirubin: 0.3 mg/dL (ref 0.2–1.2)
Total Protein: 7.1 g/dL (ref 6.0–8.3)

## 2017-12-15 LAB — CBC WITH DIFFERENTIAL/PLATELET
Basophils Absolute: 0 10*3/uL (ref 0.0–0.1)
Basophils Relative: 0.1 % (ref 0.0–3.0)
Eosinophils Absolute: 0 10*3/uL (ref 0.0–0.7)
Eosinophils Relative: 0.1 % (ref 0.0–5.0)
HCT: 37.7 % — ABNORMAL LOW (ref 39.0–52.0)
Hemoglobin: 12.7 g/dL — ABNORMAL LOW (ref 13.0–17.0)
Lymphocytes Relative: 2.6 % — ABNORMAL LOW (ref 12.0–46.0)
Lymphs Abs: 0.3 10*3/uL — ABNORMAL LOW (ref 0.7–4.0)
MCHC: 33.8 g/dL (ref 30.0–36.0)
MCV: 88.1 fl (ref 78.0–100.0)
Monocytes Absolute: 0.3 10*3/uL (ref 0.1–1.0)
Monocytes Relative: 2.6 % — ABNORMAL LOW (ref 3.0–12.0)
Neutro Abs: 11.4 10*3/uL — ABNORMAL HIGH (ref 1.4–7.7)
Neutrophils Relative %: 94.6 % — ABNORMAL HIGH (ref 43.0–77.0)
Platelets: 340 10*3/uL (ref 150.0–400.0)
RBC: 4.28 Mil/uL (ref 4.22–5.81)
RDW: 12.3 % (ref 11.5–15.5)
WBC: 12.1 10*3/uL — ABNORMAL HIGH (ref 4.0–10.5)

## 2017-12-15 LAB — SEDIMENTATION RATE: Sed Rate: 54 mm/hr — ABNORMAL HIGH (ref 0–15)

## 2017-12-15 NOTE — Progress Notes (Signed)
Review of pertinent gastrointestinal problems: 1. Distal UC;presented 2017 with intermittent rectal bleeding. Colonoscopy December 2017 Dr. Ardis Hughs found moderate inflammation from rectum up to 25 cm. The rest of the colon was normal. Terminal ileum could not be intubated. Biopsies from the inflamed segment showed chronic active inflammation. Biopsies from non-inflamed appearing segments of colon in the right and left segments were all normal. He was started on mesalamine enema and mesalamine orally.September 2018 flare, labs showed white blood cell count was 20kand I Put him on vancomycin,C. difficile by PCR was negative however. Eventual steroid pulse 40 mg once daily significantly helped him clinically.   HPI: This is a very pleasant 32 year old man whom I last saw 3 months ago  For about 3 weeks, looser more urgent stools. Then a week ago overt bleeding.  Worse urgency. Unformed stools.  mucousy stools.  LLQ pains. Achy joints.    He had cut back on coffee a bit and noted an improvement; eventually 1-2 loose BMs and one solid stool.  Was still taking imodium once per day.  Has been on full strenght apriso, QOD enema, QD enema  In past week; added pepto; prednisone 84m for a week, enemas changed to nightly. Still apriso  Definitely improved since on prednisone  Chief complaint is left-sided ulcerative colitis  ROS: complete GI ROS as described in HPI, all other review negative.  Constitutional:  No unintentional weight loss   Past Medical History:  Diagnosis Date  . Allergy   . Anxiety   . Depression   . GERD (gastroesophageal reflux disease)   . [place]romuscular disorder ([place]    acute bil lower legs neuropathy  . Substance abuse ([place]    alcohol - previous    Past Surgical History:  Procedure Laterality Date  . clavical - right repair     2010  . HARDWARE REMOVAL  2011  . HERNIA REPAIR  1991    Current Outpatient Medications  Medication Sig Dispense Refill  .  albuterol (PROVENTIL HFA;VENTOLIN HFA) 108 (90 Base) MCG/ACT inhaler Inhale 2-3 puffs into the lungs as needed for wheezing or shortness of breath.    . Ascorbic Acid (VITAMIN C) 100 MG tablet Take 100 mg by mouth daily.    . Avanafil (STENDRA) 200 MG TABS Take 1 tablet by mouth daily as needed. 10 tablet 5  . DULoxetine (CYMBALTA) 60 MG capsule Take 1 capsule (60 mg total) by mouth 2 (two) times daily. 180 capsule 1  . fluticasone (FLONASE) 50 MCG/ACT nasal spray Place into both nostrils daily.    . Loperamide HCl (IMODIUM PO) Take by mouth 2 (two) times daily.    . Loratadine (CLARITIN PO) Take by mouth.    . Mesalamine-Cleanser 4 g KIT PLACE 60 MLS (4 G TOTAL) RECTALLY AT BEDTIME. 4 kit 6  . Multiple Vitamin (MULTIVITAMIN) capsule Take 1 capsule by mouth daily.    .Marland Kitchenomeprazole (PRILOSEC) 10 MG capsule Take 10 mg by mouth daily.    . propranolol (INDERAL) 10 MG tablet TAKE 1 TABLET (10 MG TOTAL) BY MOUTH DAILY AS NEEDED. 30 tablet 2  . traZODone (DESYREL) 50 MG tablet TAKE 1 TABLET BY MOUTH EVERY DAY AT BEDTIME (Patient taking differently: TAKE 1 TABLET BY MOUTH AS NEEDED AT BEDTIME) 30 tablet 0  . VITAMIN D, CHOLECALCIFEROL, PO Take by mouth.    . mesalamine (APRISO) 0.375 g 24 hr capsule Take 4 capsules (1.5 g total) by mouth daily. 360 capsule 3   Current Facility-Administered Medications  Medication Dose Route Frequency Provider Last Rate Last Dose  . 0.9 %  sodium chloride infusion  500 mL Intravenous Continuous Milus Banister, MD        Allergies as of 12/15/2017  . (No Known Allergies)    Family History  Problem Relation Age of Onset  . Thyroid cancer Mother   . Breast cancer Maternal Grandmother   . Colon cancer Maternal Grandmother 66       died 2 weeks later  . Prostate cancer Maternal Uncle   . Esophageal cancer Maternal Grandfather        died mid 52's  . Cancer Neg Hx   . Alcohol abuse Neg Hx   . Depression Neg Hx   . Drug abuse Neg Hx   . Early death Neg Hx   .  Stroke Neg Hx   . Kidney disease Neg Hx   . Hypertension Neg Hx   . Hyperlipidemia Neg Hx   . Pancreatic cancer Neg Hx   . Rectal cancer Neg Hx   . Stomach cancer Neg Hx     Social History   Socioeconomic History  . Marital status: Single    Spouse name: Not on file  . Number of children: 0  . Years of education: Not on file  . Highest education level: Not on file  Occupational History  . Occupation: Occupational hygienist  . Financial resource strain: Not on file  . Food insecurity:    Worry: Not on file    Inability: Not on file  . Transportation needs:    Medical: Not on file    Non-medical: Not on file  Tobacco Use  . Smoking status: Light Tobacco Smoker    Types: Cigars  . Smokeless tobacco: Never Used  . Tobacco comment: 1 per day  Substance and Sexual Activity  . Alcohol use: No    Alcohol/week: 0.0 standard drinks    Comment: Alcoholism- remission - Oct 2015  . Drug use: No  . Sexual activity: Yes    Partners: Female    Birth control/protection: Condom  Lifestyle  . Physical activity:    Days per week: Not on file    Minutes per session: Not on file  . Stress: Not on file  Relationships  . Social connections:    Talks on phone: Not on file    Gets together: Not on file    Attends religious service: Not on file    Active member of club or organization: Not on file    Attends meetings of clubs or organizations: Not on file    Relationship status: Not on file  . Intimate partner violence:    Fear of current or ex partner: Not on file    Emotionally abused: Not on file    Physically abused: Not on file    Forced sexual activity: Not on file  Other Topics Concern  . Not on file  Social History Narrative  . Not on file     Physical Exam: BP 110/70   Pulse (!) 117   Ht 5' 8"  (1.727 m)   Wt 173 lb (78.5 kg)   BMI 26.30 kg/m  Constitutional: generally well-appearing Psychiatric: alert and oriented x3 Abdomen: soft, nontender, nondistended, no  obvious ascites, no peritoneal signs, normal bowel sounds No peripheral edema noted in lower extremities  Assessment and plan: 32 y.o. male with likely flare of left-sided ulcerative colitis  First I want to make sure this is truly a  flare.  He will get CBC, complete metabolic profile, sedimentation rate as well as stool testing for GI pathogen panel.  He is improving a bit already on steroids and so I do think it is very most likely a flare.  He and I have discussed increasing his maintenance therapy to Biologics in the past and we discussed that even further today.  I explained to him that [facility] which is a newer biologic, more got specific than previous Biologics, has been proven to probably be most effective in his circumstance.  I recommended that we advance his therapy to [facility] in usual 300 mg IV at week 0, week 2, week 6, then every 8 weeks.  We will need to get his up-to-date on immunizations including Pneumovax.  He needs TB testing, hepatitis B hepatitis C, HIV testing.  He is going to continue on prednisone for now at 40 mg and start to taper in about 2 weeks x 10 mg every week.  He will return to see me in 2 months and sooner if needed.  Please see the "Patient Instructions" section for addition details about the plan.  Owens Loffler, MD [facility] 12/15/2017, 9:00 AM

## 2017-12-15 NOTE — Patient Instructions (Addendum)
You will be set up for a CT scan of abdomen and pelvis with IV and oral contrast.  You will have labs checked today in the basement lab.  Please head down after you check out with the front desk  (cbc, cmet, esr, stool for GI pathogen panel).  Enyvio new start 353m IV infusion week 0, week 2, week 6 and then every 8 weeks.  Start vaccination: prevnar 13 now and then pneumovax 23 in 2 months.  HPV vaccination.  Flu shot.    Please return to see Dr. JArdis Hughsin 2 months, sooner if needed. On 02/14/18 at845am  Stay on prednisone 451mdaily for another 2 weeks and then decrease by 1035mer week until off.  Normal BMI (Body Mass Index- based on height and weight) is between 19 and 25. Your BMI today is Body mass index is 26.3 kg/m. . PMarland Kitchenease consider follow up  regarding your BMI with your Primary Care Provider.  You have been scheduled for a CT scan of the abdomen and pelvis at [facility] ---this is in the same building as [facility]  You are scheduled on 12/19/17 at 830am. You should arrive 15 minutes prior to your appointment time for registration. Please follow the written instructions below on the day of your exam:  WARNING: IF YOU ARE ALLERGIC TO IODINE/X-RAY DYE, PLEASE NOTIFY RADIOLOGY IMMEDIATELY AT [phone] WILL BE GIVEN A 13 HOUR PREMEDICATION PREP.  1) Do not eat or drink anything after 430am (4 hours prior to your test) 2) You have been given 2 bottles of oral contrast to drink. The solution may taste better if refrigerated, but do NOT add ice or any other liquid to this solution. Shake well before drinking.    Drink 1 bottle of contrast @ 630 (2 hours prior to your exam)  Drink 1 bottle of contrast @ 730 (1 hour prior to your exam)  You may take any medications as prescribed with a small amount of water except for the following: Metformin, Glucophage, Glucovance, Avandamet, Riomet, Fortamet, Actoplus Met, Janumet, Glumetza or Metaglip. The above medications must  be held the day of the exam AND 48 hours after the exam.  The purpose of you drinking the oral contrast is to aid in the visualization of your intestinal tract. The contrast solution may cause some diarrhea. Before your exam is started, you will be given a small amount of fluid to drink. Depending on your individual set of symptoms, you may also receive an intravenous injection of x-ray contrast/dye. [facility] or longer, depending on the type of exam you are having performed.  This test typically takes 30-45 minutes to complete.  If you have any questions regarding your exam or if you need to reschedule, you may call the CT department at [phone] the hours of 8:00 am and 5:00 pm, Monday-Friday.  ________________________________________________________________________

## 2017-12-15 NOTE — Telephone Encounter (Signed)
-----   Message from Dossie ArbourKelly A Tadros, LPN sent at 1/61/09608/23/2019  9:59 AM EDT ----- Regarding: Thompson GrayerEntyvio new start Please see AVS for Entyvio new start order 12/15/17

## 2017-12-15 NOTE — Telephone Encounter (Signed)
[facility] [place]

## 2017-12-15 NOTE — Telephone Encounter (Signed)
-----   Message from Libby MawAmy L Hazelwood sent at 12/15/2017 11:49 AM EDT ----- Regarding: RE: Thompson GrayerEntyvio No precert req for his plan.  You can go ahead and fax referral and records if you haven't already. Thanks, Amy ----- Message ----- From: Loretha StaplerPhelps, Samwise Eckardt L, RN Sent: 12/15/2017  11:01 AM EDT To: Libby MawAmy L Hazelwood Subject: RE: Jeannetta NapEntyvio                                    Yes sorry I will add that from now on. ----- Message ----- From: Libby MawHazelwood, Amy L Sent: 12/15/2017  10:39 AM EDT To: Loretha StaplerPatty L Alonzo Owczarzak, RN Subject: Vida RiggerEntyvio                                        Riannah Stagner, Before I call [facility] about Thompson Grayerntyvio for him, I need to confirm that you are going [facility]? There was nothing mentioned in the referral. Thanks, Amy

## 2017-12-17 LAB — HEPATITIS B CORE ANTIBODY, TOTAL: HEP B C TOTAL AB: NONREACTIVE

## 2017-12-17 LAB — QUANTIFERON-TB GOLD PLUS
Mitogen-NIL: 0.33 IU/mL
NIL: 0.06 IU/mL
QuantiFERON-TB Gold Plus: UNDETERMINED — AB
TB2-NIL: 0 [IU]/mL

## 2017-12-17 LAB — HEPATITIS C ANTIBODY
Hepatitis C Ab: NONREACTIVE
SIGNAL TO CUT-OFF: 0.01 (ref <1.00)

## 2017-12-17 LAB — HEPATITIS B SURFACE ANTIGEN: Hepatitis B Surface Ag: NONREACTIVE

## 2017-12-18 LAB — GASTROINTESTINAL PATHOGEN PANEL PCR
C. difficile Tox A/B, PCR: NOT DETECTED
CAMPYLOBACTER, PCR: NOT DETECTED
CRYPTOSPORIDIUM, PCR: NOT DETECTED
E COLI (ETEC) LT/ST, PCR: NOT DETECTED
E COLI 0157, PCR: NOT DETECTED
E coli (STEC) stx1/stx2, PCR: NOT DETECTED
GIARDIA LAMBLIA, PCR: NOT DETECTED
Norovirus, PCR: NOT DETECTED
Rotavirus A, PCR: NOT DETECTED
SHIGELLA, PCR: NOT DETECTED
Salmonella, PCR: NOT DETECTED

## 2017-12-19 ENCOUNTER — Other Ambulatory Visit: Payer: Self-pay | Admitting: Gastroenterology

## 2017-12-19 ENCOUNTER — Ambulatory Visit (HOSPITAL_COMMUNITY)
Admission: RE | Admit: 2017-12-19 | Discharge: 2017-12-19 | Disposition: A | Discharge status: Home / Self Care | Payer: 59 | Source: Ambulatory Visit | Attending: Gastroenterology | Admitting: Gastroenterology

## 2017-12-19 ENCOUNTER — Other Ambulatory Visit: Payer: Self-pay

## 2017-12-19 DIAGNOSIS — K5130 Ulcerative (chronic) rectosigmoiditis without complications: Secondary | ICD-10-CM | POA: Insufficient documentation

## 2017-12-19 DIAGNOSIS — K529 Noninfective gastroenteritis and colitis, unspecified: Secondary | ICD-10-CM | POA: Diagnosis not present

## 2017-12-19 DIAGNOSIS — Z111 Encounter for screening for respiratory tuberculosis: Secondary | ICD-10-CM

## 2017-12-19 MED ORDER — IOHEXOL 300 MG/ML  SOLN
100.0000 mL | Freq: Once | INTRAMUSCULAR | Status: AC | PRN
  Administered 2017-12-19: 100 mL via INTRAVENOUS

## 2017-12-19 MED ORDER — PREDNISONE 10 MG PO TABS: ORAL_TABLET | ORAL | 0 refills | Status: DC

## 2017-12-19 NOTE — Telephone Encounter (Signed)
Sent to pharmacy & patient notified.

## 2017-12-20 ENCOUNTER — Ambulatory Visit (INDEPENDENT_AMBULATORY_CARE_PROVIDER_SITE_OTHER)
Admission: RE | Admit: 2017-12-20 | Discharge: 2017-12-20 | Disposition: A | Discharge status: Home / Self Care | Payer: 59 | Source: Ambulatory Visit | Attending: Gastroenterology | Admitting: Gastroenterology

## 2017-12-20 ENCOUNTER — Ambulatory Visit (INDEPENDENT_AMBULATORY_CARE_PROVIDER_SITE_OTHER): Payer: 59 | Admitting: Gastroenterology

## 2017-12-20 DIAGNOSIS — K5130 Ulcerative (chronic) rectosigmoiditis without complications: Secondary | ICD-10-CM | POA: Diagnosis not present

## 2017-12-20 DIAGNOSIS — Z0000 Encounter for general adult medical examination without abnormal findings: Secondary | ICD-10-CM | POA: Diagnosis not present

## 2017-12-20 DIAGNOSIS — Z111 Encounter for screening for respiratory tuberculosis: Secondary | ICD-10-CM

## 2017-12-22 LAB — TB SKIN TEST
INDURATION: 0 mm
TB Skin Test: NEGATIVE

## 2017-12-28 ENCOUNTER — Telehealth: Payer: Self-pay

## 2017-12-28 NOTE — Telephone Encounter (Signed)
The pt is currently on 40 mg of prednisone until Saturday when he will taper down by 10 mg.  He is continuing to have abd cramping every 2-3 hours all day and night.  Has occasional very soft stool but is having 1 formed stool daily (no blood) .  He is not using enema every night due to the cramping and unable to hold it in for any length of time.  He has an appt to follow up on 10/23, however, he is concerned about tapering at this point because he is still having so much cramping.  He is fatigued from lack of sleep due to the cramping all night.  Please advise

## 2017-12-28 NOTE — Telephone Encounter (Signed)
The pt has been advised and will start tapering in 2 weeks.  [facility] will be calling the pt to set up Entyvio today.  They are working on getting him in soon.

## 2017-12-28 NOTE — Telephone Encounter (Signed)
I agree, lets push back the tapering regimen by 2 weeks (start tapering in 2 weeks from now).    How is the entyvio new start lining up?  When will he be starting?

## 2017-12-29 ENCOUNTER — Other Ambulatory Visit: Payer: Self-pay | Admitting: Gastroenterology

## 2017-12-30 DIAGNOSIS — Z23 Encounter for immunization: Secondary | ICD-10-CM | POA: Diagnosis not present

## 2018-01-04 DIAGNOSIS — K5190 Ulcerative colitis, unspecified, without complications: Secondary | ICD-10-CM | POA: Diagnosis not present

## 2018-01-04 DIAGNOSIS — Z79899 Other long term (current) drug therapy: Secondary | ICD-10-CM | POA: Diagnosis not present

## 2018-01-11 ENCOUNTER — Telehealth: Payer: Self-pay | Admitting: Gastroenterology

## 2018-01-11 MED ORDER — TRAZODONE HCL 50 MG PO TABS: ORAL_TABLET | ORAL | 1 refills | Status: DC

## 2018-01-11 NOTE — Telephone Encounter (Signed)
Sent RX to [facility] patient has follow up appointment next month.

## 2018-01-12 DIAGNOSIS — K5190 Ulcerative colitis, unspecified, without complications: Secondary | ICD-10-CM | POA: Diagnosis not present

## 2018-01-14 ENCOUNTER — Other Ambulatory Visit: Payer: Self-pay | Admitting: Gastroenterology

## 2018-01-15 ENCOUNTER — Telehealth: Payer: Self-pay

## 2018-01-15 NOTE — Telephone Encounter (Signed)
Spoke to patient to let him know that more prednisone has been sent to his pharmacy while he is tapering off. He States that his first Entyvio infusion went very well, and he recently had a flu shot.He will follow up next month for an office visit.

## 2018-01-26 DIAGNOSIS — K5190 Ulcerative colitis, unspecified, without complications: Secondary | ICD-10-CM | POA: Diagnosis not present

## 2018-02-02 ENCOUNTER — Other Ambulatory Visit: Payer: Self-pay | Admitting: Gastroenterology

## 2018-02-14 ENCOUNTER — Ambulatory Visit (INDEPENDENT_AMBULATORY_CARE_PROVIDER_SITE_OTHER): Payer: 59 | Admitting: Gastroenterology

## 2018-02-14 ENCOUNTER — Encounter: Payer: Self-pay | Admitting: Gastroenterology

## 2018-02-14 VITALS — BP 110/90 | HR 72 | Ht 68.0 in | Wt 167.0 lb

## 2018-02-14 DIAGNOSIS — K5130 Ulcerative (chronic) rectosigmoiditis without complications: Secondary | ICD-10-CM

## 2018-02-14 DIAGNOSIS — Z23 Encounter for immunization: Secondary | ICD-10-CM

## 2018-02-14 NOTE — Progress Notes (Signed)
Review of pertinent gastrointestinal problems: 1. Distal UC;presented 2017 with intermittent rectal bleeding. Colonoscopy December 2017 Dr. Ardis Hughs found moderate inflammation from rectum up to 25 cm. The rest of the colon was normal. Terminal ileum could not be intubated. Biopsies from the inflamed segment showed chronic active inflammation. Biopsies from non-inflamed appearing segments of colon in the right and left segments were all normal. He was started on mesalamine enema and mesalamine orally.September 2018 flare, labs showed white blood cell count was 20kand I Put him on vancomycin,C. difficile by PCR was negative however. Eventual steroid pulse 40 mg once daily significantly helped him clinically. Aug 2019 flaring again, required steroids again.  CT scan 11/2017 confirmed inflammation distal descending and sigmoid colon.  Stool testing neg for infection.  New start entyvio 11/2017.  Labs August 2019: HIV negative, hepatitis B negative, hepatitis C negative.  TB quant gold indeterminant followed up by normal chest x-ray, followed up by negative PPD.    HPI: This is a very pleasant 32 year old man whom I last saw about 2 months ago.    Due for 3rd entyvio in 7-10 days.  Still tapering steroids.  Currently taking 18m daily.  He is not taking any mesalamine  Rectally.  Still on apriso.  Well formed, but several times per day.  Usually urgency after eating (2-3 minutes).  No blood.  Occasional mucous.  No nocturnal issues.    Chief complaint is distal colitis  ROS: complete GI ROS as described in HPI, all other review negative.  Constitutional:  No unintentional weight loss   Past Medical History:  Diagnosis Date  . Allergy   . Anxiety   . Depression   . GERD (gastroesophageal reflux disease)   . [place]rom[place] ([place]    acute bil lower legs neuropathy  . [place]stance abuse ([place]    alcohol - previous    Past Surgical History:  Procedure Laterality Date  .  clavical - right repair     2010  . HARDWARE REMOVAL  2011  . HERNIA REPAIR  1991    Current Outpatient Medications  Medication Sig Dispense Refill  . albuterol (PROVENTIL HFA;VENTOLIN HFA) 108 (90 Base) MCG/ACT inhaler Inhale 2-3 puffs into the lungs as needed for wheezing or shortness of breath.    . APRISO 0.375 g 24 hr capsule TAKE 4 CAPSULES (1.5 G TOTAL) BY MOUTH DAILY. 360 capsule 3  . Ascorbic Acid (VITAMIN C) 100 MG tablet Take 100 mg by mouth daily.    . Avanafil (STENDRA) 200 MG TABS Take 1 tablet by mouth daily as needed. 10 tablet 5  . DULoxetine (CYMBALTA) 60 MG capsule Take 1 capsule (60 mg total) by mouth 2 (two) times daily. (Patient taking differently: Take 60 mg by mouth daily. ) 180 capsule 1  . fluticasone (FLONASE) 50 MCG/ACT nasal spray Place into both nostrils daily.    . Loperamide HCl (IMODIUM PO) Take by mouth 2 (two) times daily.    . Loratadine (CLARITIN PO) Take by mouth.    . Multiple Vitamin (MULTIVITAMIN) capsule Take 1 capsule by mouth daily.    .Marland Kitchenomeprazole (PRILOSEC) 10 MG capsule Take 10 mg by mouth daily.    . predniSONE (DELTASONE) 10 MG tablet TAKE 4 TABS BY MOUTH DAILY FOR 2 WEEKS THEN DECREASE BY 1 TAB PER WEEK UNTIL OFF 100 tablet 0  . propranolol (INDERAL) 10 MG tablet TAKE 1 TABLET (10 MG TOTAL) BY MOUTH DAILY AS NEEDED. 30 tablet 2  . traZODone (DESYREL) 50  MG tablet TAKE 1 TABLET BY MOUTH AS NEEDED AT BEDTIME 90 tablet 1  . VITAMIN D, CHOLECALCIFEROL, PO Take by mouth.     Current Facility-Administered Medications  Medication Dose Route Frequency Provider Last Rate Last Dose  . 0.9 %  sodium chloride infusion  500 mL Intravenous Continuous Milus Banister, MD        Allergies as of 02/14/2018  . (No Known Allergies)    Family History  Problem Relation Age of Onset  . Thyroid cancer Mother   . Breast cancer Maternal Grandmother   . Colon cancer Maternal Grandmother 41       died 2 weeks later  . Prostate cancer Maternal Uncle   .  Esophageal cancer Maternal Grandfather        died mid 1's  . Cancer Neg Hx   . Alcohol abuse Neg Hx   . Depression Neg Hx   . Drug abuse Neg Hx   . Early death Neg Hx   . Stroke Neg Hx   . Kidney disease Neg Hx   . Hypertension Neg Hx   . Hyperlipidemia Neg Hx   . Pancreatic cancer Neg Hx   . Rectal cancer Neg Hx   . Stomach cancer Neg Hx     Social History   Socioeconomic History  . Marital status: Single    Spouse name: Not on file  . Number of children: 0  . Years of education: Not on file  . Highest education level: Not on file  Occupational History  . Occupation: Occupational hygienist  . Financial resource strain: Not on file  . Food insecurity:    Worry: Not on file    Inability: Not on file  . Transportation needs:    Medical: Not on file    Non-medical: Not on file  Tobacco Use  . Smoking status: Light Tobacco Smoker    Types: Cigars  . Smokeless tobacco: Never Used  . Tobacco comment: 1 per day  Substance and Sexual Activity  . Alcohol use: No    Alcohol/week: 0.0 standard drinks    Comment: Alcoholism- remission - Oct 2015  . Drug use: No  . Sexual activity: Yes    Partners: Female    Birth control/protection: Condom  Lifestyle  . Physical activity:    Days per week: Not on file    Minutes per session: Not on file  . Stress: Not on file  Relationships  . Social connections:    Talks on phone: Not on file    Gets together: Not on file    Attends religious service: Not on file    Active member of club or organization: Not on file    Attends meetings of clubs or organizations: Not on file    Relationship status: Not on file  . Intimate partner violence:    Fear of current or ex partner: Not on file    Emotionally abused: Not on file    Physically abused: Not on file    Forced sexual activity: Not on file  Other Topics Concern  . Not on file  Social History Narrative  . Not on file     Physical Exam: BP 110/90 (BP Location: Left Arm,  Patient Position: Sitting, Cuff Size: Normal)   Pulse 72   Ht 5' 8"  (1.727 m)   Wt 167 lb (75.8 kg)   BMI 25.39 kg/m  Constitutional: generally well-appearing Psychiatric: alert and oriented x3 Abdomen: soft, nontender, nondistended, no  obvious ascites, no peritoneal signs, normal bowel sounds No peripheral edema noted in lower extremities  Assessment and plan: 32 y.o. male with left-sided ulcerative colitis  He is doing well, clinically improving.  Still has some urgency with meals but bleeding is stopped, significant frequency has stopped.  His stools are solid.  He is due for his third Entyvio infusion in about 7 to 10 days.  After that he will have an 8-week infusion and I would like to see him back in the office in mid January which will be shortly after his fourth infusion.  He will continue Lialda for now but my goal will be to taper him off that eventually as well.  Please see the "Patient Instructions" section for addition details about the plan.  Owens Loffler, MD [facility] 02/14/2018, 9:03 AM

## 2018-02-14 NOTE — Patient Instructions (Addendum)
Mid January ROV with Dr. Ardis Hughs. Continue entyvio and lialda for now.  Thank you for entrusting me with your care and choosing [facility].  Dr Ardis Hughs

## 2018-02-23 DIAGNOSIS — K5190 Ulcerative colitis, unspecified, without complications: Secondary | ICD-10-CM | POA: Diagnosis not present

## 2018-04-06 ENCOUNTER — Other Ambulatory Visit: Payer: Self-pay | Admitting: Internal Medicine

## 2018-04-06 DIAGNOSIS — F411 Generalized anxiety disorder: Secondary | ICD-10-CM

## 2018-04-20 DIAGNOSIS — K5190 Ulcerative colitis, unspecified, without complications: Secondary | ICD-10-CM | POA: Diagnosis not present

## 2018-06-15 DIAGNOSIS — K5190 Ulcerative colitis, unspecified, without complications: Secondary | ICD-10-CM | POA: Diagnosis not present

## 2018-06-25 ENCOUNTER — Ambulatory Visit (INDEPENDENT_AMBULATORY_CARE_PROVIDER_SITE_OTHER): Payer: 59 | Admitting: Gastroenterology

## 2018-06-25 ENCOUNTER — Encounter: Payer: Self-pay | Admitting: Gastroenterology

## 2018-06-25 VITALS — BP 113/62 | HR 78 | Ht 69.0 in | Wt 171.2 lb

## 2018-06-25 DIAGNOSIS — K5130 Ulcerative (chronic) rectosigmoiditis without complications: Secondary | ICD-10-CM

## 2018-06-25 NOTE — Progress Notes (Signed)
Review of pertinent gastrointestinal problems: 1. Distal UC;presented 2017 with intermittent rectal bleeding. Colonoscopy December 2017 Dr. Ardis Hughs found moderate inflammation from rectum up to 25 cm. The rest of the colon was normal. Terminal ileum could not be intubated. Biopsies from the inflamed segment showed chronic active inflammation.Biopsies from non-inflamed appearing segments of colon in the right and left segments were all normal. He was started on mesalamine enema and mesalamine orally.September 2018 flare, labs showed white blood cell count was 20kand I Put him on vancomycin,C. difficile by PCR was negative however. Eventual steroid pulse 40 mg once daily significantly helped him clinically. Aug 2019 flaring again, required steroids again.  CT scan 11/2017 confirmed inflammation distal descending and sigmoid colon.  Stool testing neg for infection.  New start entyvio 11/2017.  Labs August 2019: HIV negative, hepatitis B negative, hepatitis C negative.  TB quant gold indeterminant followed up by normal chest x-ray, followed up by negative PPD.   HPI: This is a very pleasant 33 year old man whom I last saw 3 to 4 months ago.  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  He started [facility] for 5 months ago.  His bowels are not yet back to their pre-colitis pattern but they are much more manageable than previously.  He is overall quite content.  A day or two of loose bowels, rarely this is 2 days in a row.  Imodium will help.  Usually 1-2 sold formed stools per day  Especially in AM takes 3-4 runs to bathroom, eventually a real BM. Several trips in AM, culminating in good BM.  Can have urgency after eating, lmuch less frequent or inconvenient.  Logistics of entyvio work well.  Usually towards end of week 8 he will see some minor blood in TP.   Chief complaint is left-sided  colitis  ROS: complete GI ROS as described in HPI, all other review negative.  Constitutional:  No unintentional weight loss   Past Medical History:  Diagnosis Date  . Allergy   . Anxiety   . Depression   . GERD (gastroesophageal reflux disease)   . Neuromuscular disorder ([place])    acute bil lower legs neuropathy  . Substance abuse ([place])    alcohol - previous    Past Surgical History:  Procedure Laterality Date  . clavical - right repair     2010  . HARDWARE REMOVAL  2011  . HERNIA REPAIR  1991    Current Outpatient Medications  Medication Sig Dispense Refill  . albuterol (PROVENTIL HFA;VENTOLIN HFA) 108 (90 Base) MCG/ACT inhaler Inhale 2-3 puffs into the lungs as needed for wheezing or shortness of breath.    . APRISO 0.375 g 24 hr capsule TAKE 4 CAPSULES (1.5 G TOTAL) BY MOUTH DAILY. 360 capsule 3  . Ascorbic Acid (VITAMIN C) 100 MG tablet Take 100 mg by mouth daily.    . Avanafil (STENDRA) 200 MG TABS Take 1 tablet by mouth daily as needed. 10 tablet 5  . DULoxetine (CYMBALTA) 60 MG capsule Take 1 capsule (60 mg total) by mouth daily. 90 capsule 1  . fluticasone (FLONASE) 50 MCG/ACT nasal spray Place into both nostrils daily.    . Loperamide HCl (IMODIUM PO) Take by mouth 2 (two) times daily.    . Loratadine (CLARITIN PO) Take by mouth.    . Multiple Vitamin (MULTIVITAMIN) capsule Take 1 capsule by mouth daily.    Marland Kitchen  omeprazole (PRILOSEC) 10 MG capsule Take 10 mg by mouth daily.    . propranolol (INDERAL) 10 MG tablet TAKE 1 TABLET (10 MG TOTAL) BY MOUTH DAILY AS NEEDED. 30 tablet 2  . traZODone (DESYREL) 50 MG tablet TAKE 1 TABLET BY MOUTH AS NEEDED AT BEDTIME 90 tablet 1  . VITAMIN D, CHOLECALCIFEROL, PO Take by mouth.     Current Facility-Administered Medications  Medication Dose Route Frequency Provider Last Rate Last Dose  . 0.9 %  sodium chloride infusion  500 mL Intravenous Continuous Milus Banister, MD        Allergies as of 06/25/2018  . (No Known  Allergies)    Family History  Problem Relation Age of Onset  . Thyroid cancer Mother   . Breast cancer Maternal Grandmother   . Colon cancer Maternal Grandmother 54       died 2 weeks later  . Prostate cancer Maternal Uncle   . Esophageal cancer Maternal Grandfather        died mid 44's  . Cancer Neg Hx   . Alcohol abuse Neg Hx   . Depression Neg Hx   . Drug abuse Neg Hx   . Early death Neg Hx   . Stroke Neg Hx   . Kidney disease Neg Hx   . Hypertension Neg Hx   . Hyperlipidemia Neg Hx   . Pancreatic cancer Neg Hx   . Rectal cancer Neg Hx   . Stomach cancer Neg Hx     Social History   Socioeconomic History  . Marital status: Single    Spouse name: Not on file  . Number of children: 0  . Years of education: Not on file  . Highest education level: Not on file  Occupational History  . Occupation: Occupational hygienist  . Financial resource strain: Not on file  . Food insecurity:    Worry: Not on file    Inability: Not on file  . Transportation needs:    Medical: Not on file    Non-medical: Not on file  Tobacco Use  . Smoking status: Light Tobacco Smoker    Types: Cigars  . Smokeless tobacco: Never Used  . Tobacco comment: 1 per day  Substance and Sexual Activity  . Alcohol use: No    Alcohol/week: 0.0 standard drinks    Comment: Alcoholism- remission - Oct 2015  . Drug use: No  . Sexual activity: Yes    Partners: Female    Birth control/protection: Condom  Lifestyle  . Physical activity:    Days per week: Not on file    Minutes per session: Not on file  . Stress: Not on file  Relationships  . Social connections:    Talks on phone: Not on file    Gets together: Not on file    Attends religious service: Not on file    Active member of club or organization: Not on file    Attends meetings of clubs or organizations: Not on file    Relationship status: Not on file  . Intimate partner violence:    Fear of current or ex partner: Not on file     Emotionally abused: Not on file    Physically abused: Not on file    Forced sexual activity: Not on file  Other Topics Concern  . Not on file  Social History Narrative  . Not on file     Physical Exam: Constitutional: generally well-appearing Psychiatric: alert and oriented x3 Abdomen: soft,  nontender, nondistended, no obvious ascites, no peritoneal signs, normal bowel sounds No peripheral edema noted in lower extremities  Assessment and plan: 33 y.o. male with left-sided colitis  His bowels are not back to their pre-colitis pattern but overall much improved.  He is much more content with his bowels.  He does seem to have some symptoms during the seventh week just before his Entyvio maintenance dose.  He will also have some sputtering of stools in the morning which was not his pattern before.  He used to have 1 bowel movement after a cup of coffee every morning.  Now he tends to run to the bathroom 3 or 4 times that eventually culminates in a good bowel movement.  He will intermittently have some mild urgency as well.  No abdominal pains.  His weight is been stable.  I recommended that he stop the oral mesalamine.  I do not think it is adding anything to his treatment regimen and certainly oral mesalamine can cause loose stools, colitis for some.  That is the only change he want to make now and he will return to see me in 3 or 4 months and call sooner if any problems.  Please see the "Patient Instructions" section for addition details about the plan.  Owens Loffler, MD [facility] 06/25/2018, 8:57 AM

## 2018-06-25 NOTE — Patient Instructions (Signed)
Stop apriso. Continue entyvio every 8 weeks.  Please return to see Dr. Ardis Hughs in 3-4 months, call sooner with any concerns.

## 2018-08-10 DIAGNOSIS — K5190 Ulcerative colitis, unspecified, without complications: Secondary | ICD-10-CM | POA: Diagnosis not present

## 2018-10-05 ENCOUNTER — Other Ambulatory Visit: Payer: Self-pay | Admitting: Internal Medicine

## 2018-10-05 DIAGNOSIS — F411 Generalized anxiety disorder: Secondary | ICD-10-CM

## 2019-01-01 ENCOUNTER — Other Ambulatory Visit: Payer: Self-pay | Admitting: Internal Medicine

## 2019-01-01 DIAGNOSIS — F411 Generalized anxiety disorder: Secondary | ICD-10-CM

## 2019-01-01 MED ORDER — DULOXETINE HCL 60 MG PO CPEP: 60.0000 mg | ORAL_CAPSULE | Freq: Every day | ORAL | 1 refills | Status: DC

## 2019-01-17 ENCOUNTER — Other Ambulatory Visit: Payer: Self-pay | Admitting: Internal Medicine

## 2019-01-17 DIAGNOSIS — N522 Drug-induced erectile dysfunction: Secondary | ICD-10-CM

## 2019-01-18 ENCOUNTER — Telehealth: Payer: Self-pay | Admitting: Gastroenterology

## 2019-01-18 DIAGNOSIS — K5130 Ulcerative (chronic) rectosigmoiditis without complications: Secondary | ICD-10-CM

## 2019-01-18 NOTE — Telephone Encounter (Signed)
Tried again to reach the pt and did get voice mail. Left a message for the pt to return call.

## 2019-01-18 NOTE — Telephone Encounter (Signed)
The pt complains of increased urgency with watery diarrhea every hour starting late yesterday. Has used imodium 3 tabs in the past 24 hours without relief.  No bleeding.  No fever.  Has prednisone left over from last flare.

## 2019-01-18 NOTE — Telephone Encounter (Signed)
Tried to call the pt and the line rang with no answer then went to a busy signal.

## 2019-01-19 NOTE — Telephone Encounter (Signed)
I just spoke with Blake Chapman.  He is feeling significantly less urgency after 2 days of prednisone 37m. He has no travel or sick contacts, no fevers or chills, no Abx in the past 3-4 months.  He will continue prednisone for now. He is due for his 8 week entyvio (vedolizumab)  dose on Friday.    Can you please contact him on Monday about setting up trough labs (vedolizumab drug level and also vedolizumab antibody) on either Wednesday, Thursday or early Friday before his vedolizumab dose.    He also may need more prednisone (475mdaily, disp 1 month with 2 refills).

## 2019-01-21 MED ORDER — PREDNISONE 20 MG PO TABS: 40.0000 mg | ORAL_TABLET | Freq: Every day | ORAL | 2 refills | Status: AC

## 2019-01-21 NOTE — Telephone Encounter (Signed)
The pt has been advised and will have the trough level early on Friday morning.  The order is in Epic as well as prednisone refill sent to the pharmacy.

## 2019-01-25 ENCOUNTER — Other Ambulatory Visit: Payer: 59

## 2019-01-25 DIAGNOSIS — K5130 Ulcerative (chronic) rectosigmoiditis without complications: Secondary | ICD-10-CM

## 2019-02-08 ENCOUNTER — Other Ambulatory Visit: Payer: Self-pay | Admitting: Gastroenterology

## 2019-02-08 ENCOUNTER — Telehealth: Payer: Self-pay | Admitting: Gastroenterology

## 2019-02-08 LAB — VEDOLIZUMAB AND ANTI-VEDO AB
Anti-Vedolizumab Antibody: <25 ng/mL
Vedolizumab: 5.7 ug/mL

## 2019-02-08 MED ORDER — TRAZODONE HCL 50 MG PO TABS: ORAL_TABLET | ORAL | 1 refills | Status: DC

## 2019-02-08 NOTE — Telephone Encounter (Signed)
New script for Trazodone sent to pharmacy

## 2019-02-11 ENCOUNTER — Other Ambulatory Visit: Payer: Self-pay

## 2019-02-11 DIAGNOSIS — K5130 Ulcerative (chronic) rectosigmoiditis without complications: Secondary | ICD-10-CM

## 2019-03-05 ENCOUNTER — Telehealth: Payer: Self-pay | Admitting: Gastroenterology

## 2019-03-05 NOTE — Telephone Encounter (Signed)
I spoke with Blake Chapman and advised her that the order was faxed on 10/19 to [facility].  She asked to have the order refaxed to her attention at [phone]. I have sent this again to her attention.

## 2019-03-15 ENCOUNTER — Encounter: Payer: Self-pay | Admitting: Gastroenterology

## 2019-03-15 ENCOUNTER — Other Ambulatory Visit: Payer: Self-pay

## 2019-03-15 ENCOUNTER — Ambulatory Visit (INDEPENDENT_AMBULATORY_CARE_PROVIDER_SITE_OTHER): Payer: 59 | Admitting: Gastroenterology

## 2019-03-15 VITALS — BP 118/88 | HR 90 | Temp 97.5°F | Ht 72.0 in | Wt 166.6 lb

## 2019-03-15 DIAGNOSIS — K5130 Ulcerative (chronic) rectosigmoiditis without complications: Secondary | ICD-10-CM | POA: Diagnosis not present

## 2019-03-15 NOTE — Progress Notes (Signed)
Review of pertinent gastrointestinal problems: 1. Distal UC;presented 2017 with intermittent rectal bleeding. Colonoscopy December 2017 Dr. Ardis Hughs found moderate inflammation from rectum up to 25 cm. The rest of the colon was normal. Terminal ileum could not be intubated. Biopsies from the inflamed segment showed chronic active inflammation.Biopsies from non-inflamed appearing segments of colon in the right and left segments were all normal. He was started on mesalamine enema and mesalamine orally.September 2018 flare, labs showed white blood cell count was 20kand I Put him on vancomycin,C. difficile by PCR was negative however. Eventual steroid pulse 40 mg once daily significantly helped him clinically.Aug 2019 flaring again, sed rate 54, required steroids again.CT scan 11/2017 confirmed inflammation distal descending and sigmoid colon. Stool testing neg for infection.  New start entyvio8/2019.  Labs August 2019:HIV negative, hepatitis B negative, hepatitis Cnegative. TB quant gold indeterminant followed up by normal chest x-ray, followed up by negative PPD.  01/2019: entyvio Antibodies negative; entyvio trough drug leve 5.7; increased dosing to every 6 weeks instead of every 8    HPI: This is a very pleasant 33 year old man whom I last saw about 2 months ago.  I change his Entyvio to every 6-week dosing based on suboptimal trough drug level.  Fortunately due to insurance issues he was not able to start that dosing regimen until after his upcoming dose which is right about 8 weeks, next Tuesday.  He has been gradually tapering his prednisone and noticed after a few days at 20 mg/day his stools became slightly looser and he would become more gassy.  He is bothered by urgency a bit.  Only very very rare blood on the toilet paper  Chief complaint is distal ulcerative colitis  ROS: complete GI ROS as described in HPI, all other review negative.  Constitutional:  No unintentional weight  loss   Past Medical History:  Diagnosis Date  . Allergy   . Anxiety   . Depression   . GERD (gastroesophageal reflux disease)   . Neuromuscular disorder ([place])    acute bil lower legs neuropathy  . Substance abuse ([place])    alcohol - previous    Past Surgical History:  Procedure Laterality Date  . clavical - right repair     2010  . HARDWARE REMOVAL  2011  . HERNIA REPAIR  1991    Current Outpatient Medications  Medication Sig Dispense Refill  . albuterol (PROVENTIL HFA;VENTOLIN HFA) 108 (90 Base) MCG/ACT inhaler Inhale 2-3 puffs into the lungs as needed for wheezing or shortness of breath.    . APRISO 0.375 g 24 hr capsule TAKE 4 CAPSULES (1.5 G TOTAL) BY MOUTH DAILY. 360 capsule 3  . Ascorbic Acid (VITAMIN C) 100 MG tablet Take 100 mg by mouth daily.    . DULoxetine (CYMBALTA) 60 MG capsule Take 1 capsule (60 mg total) by mouth daily. 90 capsule 1  . fluticasone (FLONASE) 50 MCG/ACT nasal spray Place into both nostrils daily.    . Loperamide HCl (IMODIUM PO) Take by mouth 2 (two) times daily.    . Loratadine (CLARITIN PO) Take by mouth.    . Multiple Vitamin (MULTIVITAMIN) capsule Take 1 capsule by mouth daily.    Marland Kitchen omeprazole (PRILOSEC) 10 MG capsule Take 10 mg by mouth daily.    . propranolol (INDERAL) 10 MG tablet TAKE 1 TABLET (10 MG TOTAL) BY MOUTH DAILY AS NEEDED. 30 tablet 2  . sodium chloride 0.9 % SOLN 250 mL with vedolizumab 300 MG SOLR 300 mg Inject 300 mg  into the vein every 6 (six) weeks.    Marland Kitchen STENDRA 200 MG TABS TAKE 1 TABLET ORALLY 15 TO 30 MIN BEFORE INTERCOURSE AS DIRECTED 6 tablet 3  . traZODone (DESYREL) 50 MG tablet Take 1 tab at bedtime as needed 90 tablet 1  . VITAMIN D, CHOLECALCIFEROL, PO Take by mouth.     Current Facility-Administered Medications  Medication Dose Route Frequency Provider Last Rate Last Dose  . 0.9 %  sodium chloride infusion  500 mL Intravenous Continuous Milus Banister, MD        Allergies as of 03/15/2019  . (No Known  Allergies)    Family History  Problem Relation Age of Onset  . Thyroid cancer Mother   . Breast cancer Maternal Grandmother   . Colon cancer Maternal Grandmother 38       died 2 weeks later  . Prostate cancer Maternal Uncle   . Esophageal cancer Maternal Grandfather        died mid 42's  . Cancer Neg Hx   . Alcohol abuse Neg Hx   . Depression Neg Hx   . Drug abuse Neg Hx   . Early death Neg Hx   . Stroke Neg Hx   . Kidney disease Neg Hx   . Hypertension Neg Hx   . Hyperlipidemia Neg Hx   . Pancreatic cancer Neg Hx   . Rectal cancer Neg Hx   . Stomach cancer Neg Hx     Social History   Socioeconomic History  . Marital status: Single    Spouse name: Not on file  . Number of children: 0  . Years of education: Not on file  . Highest education level: Not on file  Occupational History  . Occupation: Occupational hygienist  . Financial resource strain: Not on file  . Food insecurity    Worry: Not on file    Inability: Not on file  . Transportation needs    Medical: Not on file    Non-medical: Not on file  Tobacco Use  . Smoking status: Light Tobacco Smoker    Types: Cigars  . Smokeless tobacco: Never Used  . Tobacco comment: 1 per day  Substance and Sexual Activity  . Alcohol use: No    Alcohol/week: 0.0 standard drinks    Comment: Alcoholism- remission - Oct 2015  . Drug use: No  . Sexual activity: Yes    Partners: Female    Birth control/protection: Condom  Lifestyle  . Physical activity    Days per week: Not on file    Minutes per session: Not on file  . Stress: Not on file  Relationships  . Social Herbalist on phone: Not on file    Gets together: Not on file    Attends religious service: Not on file    Active member of club or organization: Not on file    Attends meetings of clubs or organizations: Not on file    Relationship status: Not on file  . Intimate partner violence    Fear of current or ex partner: Not on file    Emotionally  abused: Not on file    Physically abused: Not on file    Forced sexual activity: Not on file  Other Topics Concern  . Not on file  Social History Narrative  . Not on file     Physical Exam: BP 118/88   Pulse 90   Temp (!) 97.5 F (36.4 C)  Ht 6' (1.829 m)   Wt 166 lb 9.6 oz (75.6 kg)   BMI 22.60 kg/m  Constitutional: generally well-appearing Psychiatric: alert and oriented x3 Abdomen: soft, nontender, nondistended, no obvious ascites, no peritoneal signs, normal bowel sounds No peripheral edema noted in lower extremities  Assessment and plan: 33 y.o. male with distal ulcerative colitis  I am still hopeful that Entyvio at every 6 weeks rather than every 8 will make a difference here for him and allow him to start tapering his prednisone further until off.  He will return to see me in 2 months and sooner if needed.  Please see the "Patient Instructions" section for addition details about the plan.  Owens Loffler, MD [facility] 03/15/2019, 3:03 PM

## 2019-03-15 NOTE — Patient Instructions (Addendum)
Stay on Entyvio every 6 weeks.   Decrease your prednisone down by 5 mg every other week after your upcoming Entyvio infusion.   Follow up with Dr. Ardis Hughs in 2 months.

## 2019-03-26 ENCOUNTER — Telehealth: Payer: Self-pay | Admitting: Gastroenterology

## 2019-03-26 NOTE — Telephone Encounter (Signed)
   Can you please call him and let him know that I saw his recent lab tests results from his primary care physician.  They all look very good.  Specifically his C-reactive protein and his sedimentation rate were both normal although the sed rate was at the very upper limit of normal.  White count was slightly elevated at 14,000 probably from his steroids.  No changes from previous recommendations at his office visit recently.

## 2019-03-26 NOTE — Telephone Encounter (Signed)
The patient has been notified of this information and all questions answered. The pt has been advised of the information and verbalized understanding.    

## 2019-07-22 ENCOUNTER — Other Ambulatory Visit: Payer: Self-pay | Admitting: Internal Medicine

## 2019-07-22 DIAGNOSIS — F411 Generalized anxiety disorder: Secondary | ICD-10-CM

## 2019-08-12 ENCOUNTER — Telehealth: Payer: Self-pay

## 2019-08-12 MED ORDER — TRAZODONE HCL 50 MG PO TABS: ORAL_TABLET | ORAL | 3 refills | Status: DC

## 2019-08-12 NOTE — Telephone Encounter (Signed)
Rx sent to pharmacy as requested.

## 2019-08-12 NOTE — Telephone Encounter (Signed)
-----   Message from Rachael Fee, MD sent at 08/12/2019  1:31 PM EDT ----- Regarding: RE: Refill request Yes, that is OK with me. Same dose, 90 days with 3 refills.    [place] ----- Message ----- From: Lamona Curl, CMA Sent: 08/12/2019   1:23 PM EDT To: Rachael Fee, MD Subject: Refill request                                 Is it OK for me to refill trazodone for this patient?  Refill request is asking for 90 supply.  Please advise.  Thank you

## 2019-09-27 ENCOUNTER — Other Ambulatory Visit: Payer: Self-pay | Admitting: Internal Medicine

## 2019-09-27 DIAGNOSIS — N522 Drug-induced erectile dysfunction: Secondary | ICD-10-CM

## 2019-12-16 ENCOUNTER — Other Ambulatory Visit: Payer: Self-pay | Admitting: Internal Medicine

## 2019-12-16 DIAGNOSIS — N522 Drug-induced erectile dysfunction: Secondary | ICD-10-CM

## 2019-12-28 IMAGING — CT CT ABD-PELV W/ CM
2 of 4 series · 16 of 46 positions shown, 18 images · IV contrast (OMNIPAQUE)
Comparison: None.

CLINICAL DATA: History of ulcerative colitis. Flare-up 1-2 weeks
ago on prednisone.

EXAM:
CT ABDOMEN AND PELVIS WITH CONTRAST
TECHNIQUE: Multidetector CT imaging of the abdomen and pelvis was performed
using the standard protocol following bolus administration of
intravenous contrast.
CONTRAST:  100mL OMNIPAQUE IOHEXOL 300 MG/ML  SOLN

[Series 2: axial st · axial · 0.82mm/px · z∈[+1212,+1637]mm · 13 of 97 slices shown, 15 images]
[im 6/97  soft-tissue]
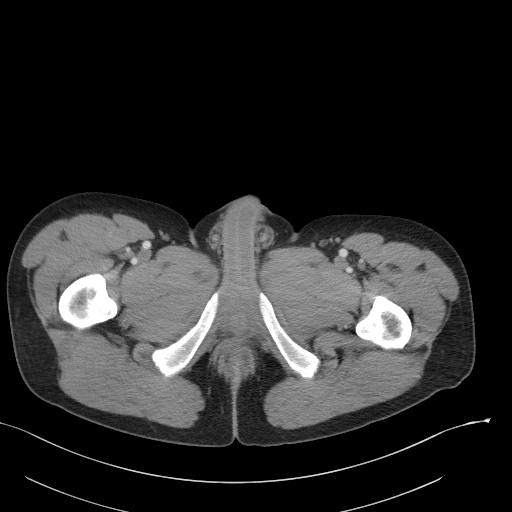
[im 6/97  bone]
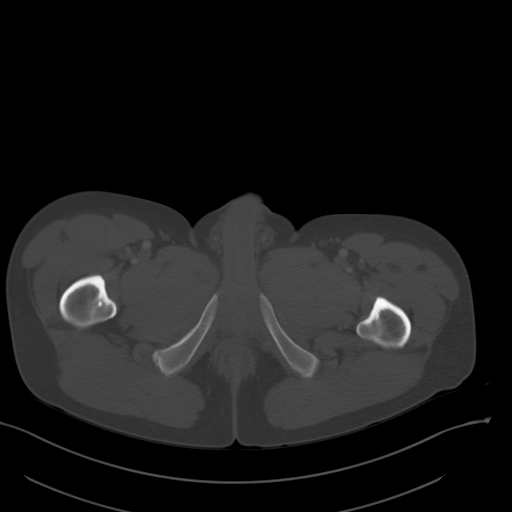
[im 11/97  soft-tissue]
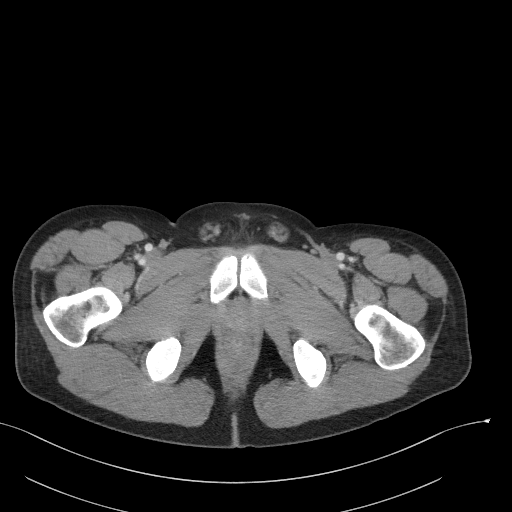
[im 22/97  soft-tissue]
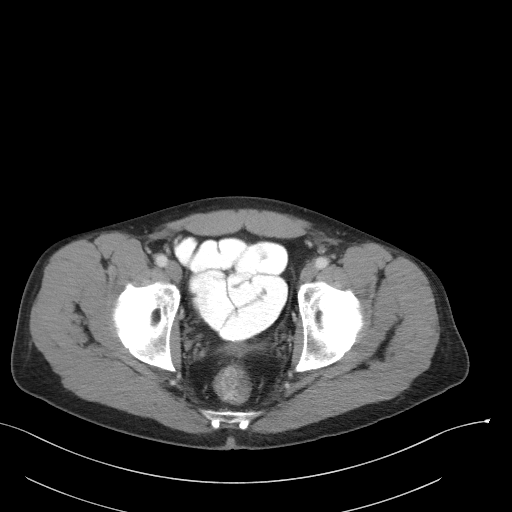
[im 27/97  soft-tissue]
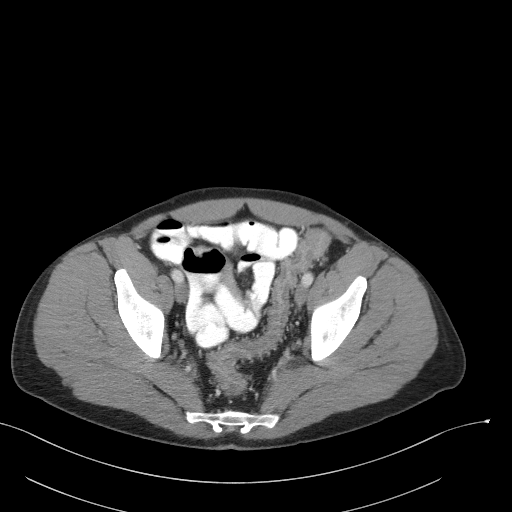
[im 33/97  soft-tissue]
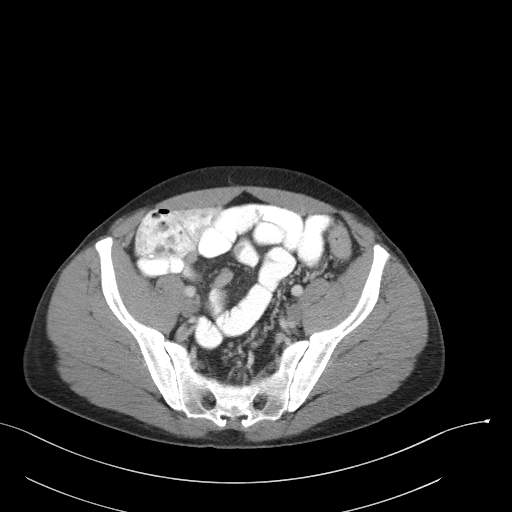
[im 43/97  soft-tissue]
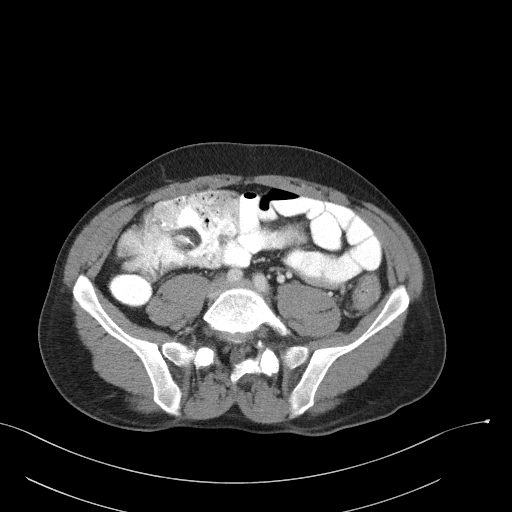
[im 49/97  soft-tissue]
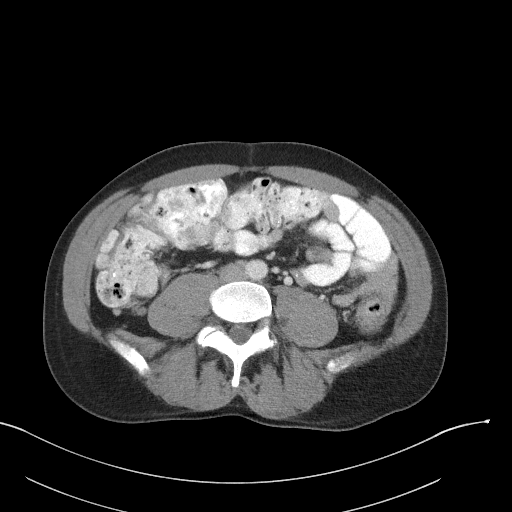
[im 54/97  soft-tissue]
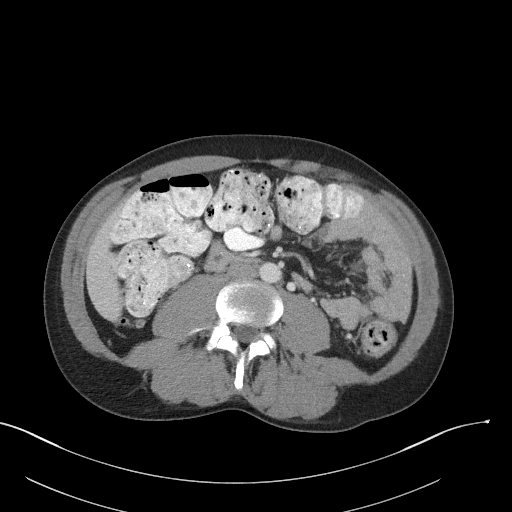
[im 65/97  soft-tissue]
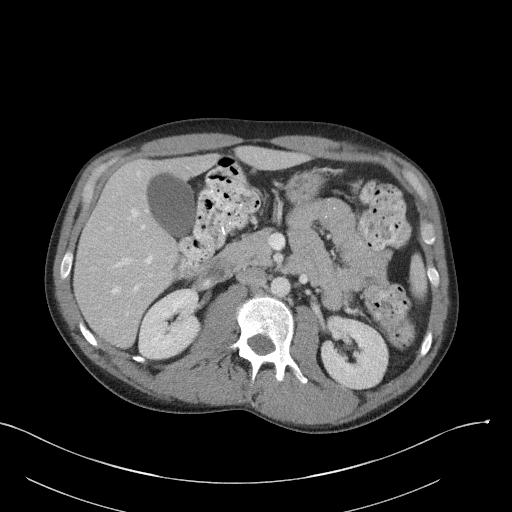
[im 65/97  bone]
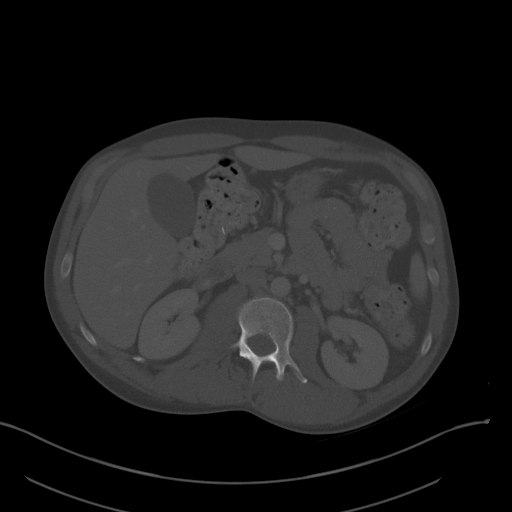
[im 70/97  soft-tissue]
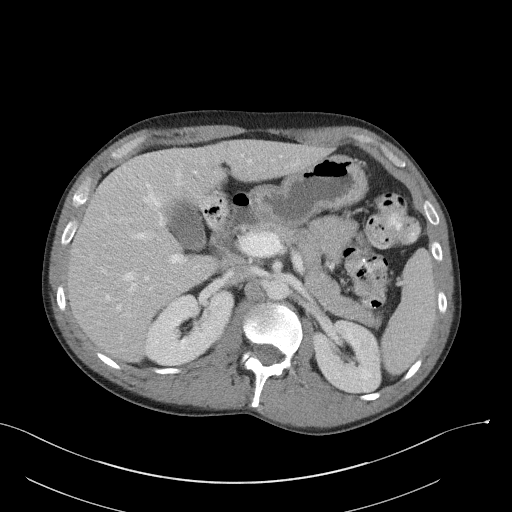
[im 75/97  soft-tissue]
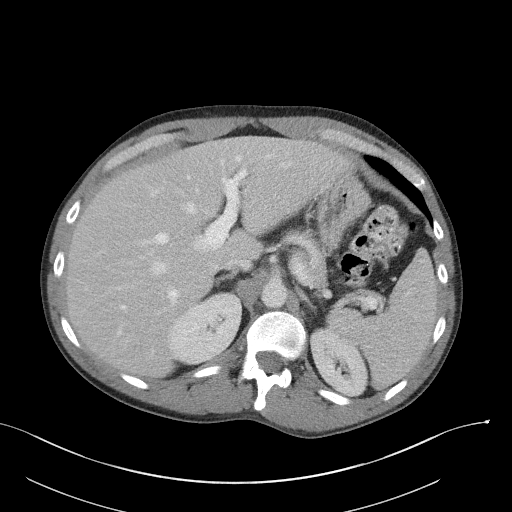
[im 86/97  soft-tissue]
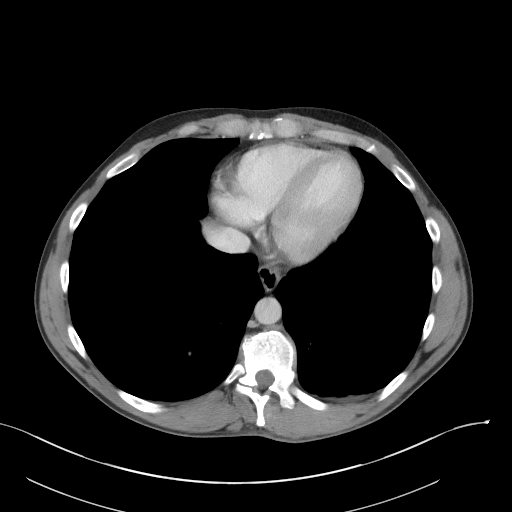
[im 91/97  soft-tissue]
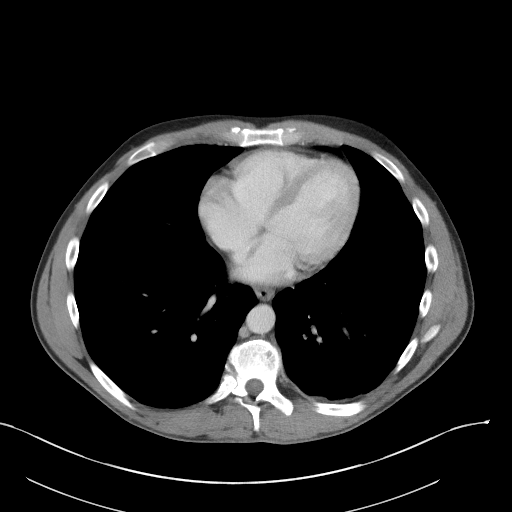

[Series 4: coronal st · coronal · 0.84mm/px · 3 of 86 slices shown]
[im 29/86  soft-tissue]
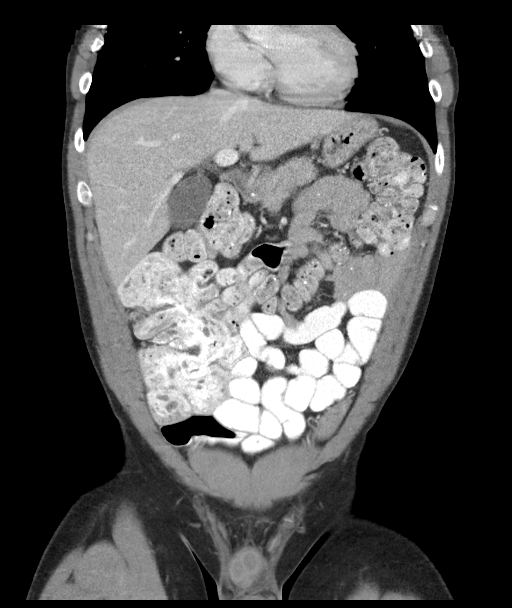
[im 38/86  soft-tissue]
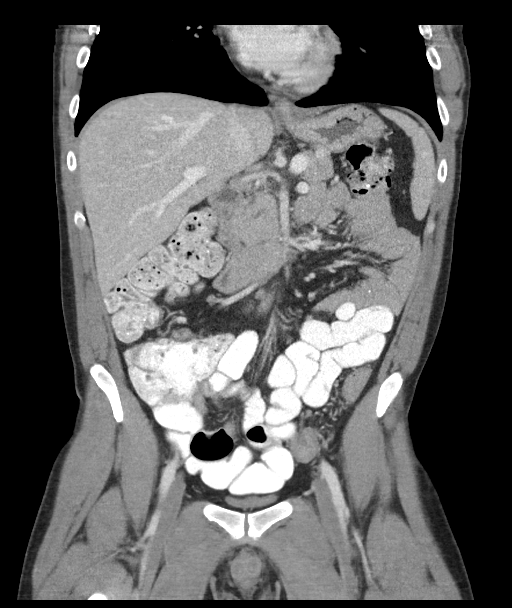
[im 48/86  soft-tissue]
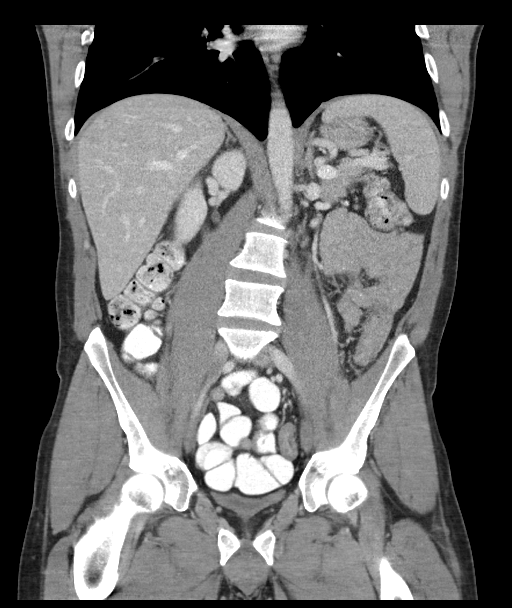

[16 of 46 positions shown; findings below may reference images not displayed]

FINDINGS: Lower chest: The lung bases are clear of acute process. No pleural
effusion or pulmonary lesions. The heart is normal in size. No
pericardial effusion. The distal esophagus and aorta are
unremarkable.

Hepatobiliary: No focal hepatic lesions or intrahepatic biliary
dilatation. The gallbladder is normal. No common bile duct
dilatation.

Pancreas: No mass, inflammation or ductal dilatation.

Spleen: Normal size.  No focal lesions.

Adrenals/Urinary Tract: The adrenal glands and kidneys are normal.
The bladder is normal.

Stomach/Bowel: The stomach, duodenum, small bowel and terminal ileum
are normal. The appendix is normal. Moderate stool in the colon is
noted. There does appear to be a mild inflammatory process involving
the distal descending colon and sigmoid colon, likely treated
ulcerative colitis. No complicating features.

Vascular/Lymphatic: The aorta is normal in caliber. No dissection.
The branch vessels are patent. The major venous structures are
patent. No mesenteric or retroperitoneal mass or adenopathy. Small
scattered lymph nodes are noted.

Reproductive: The prostate gland and seminal vesicles are
unremarkable.

Other: No pelvic mass or adenopathy. No free pelvic fluid
collections. No inguinal mass or adenopathy. No abdominal wall
hernia or subcutaneous lesions.

Musculoskeletal: Significant scoliosis but no acute bony findings.
No findings for sacroiliitis. Mild-to-moderate bilateral hip joint
degenerative changes for age. No findings for AVN.
IMPRESSION: 1. Mild inflammatory process involving the distal descending colon
and sigmoid colon likely resolving/treated ulcerative colitis. The
remainder of the colon is unremarkable. There is moderate stool
noted.
2. No other significant abdominal/pelvic findings

## 2019-12-29 IMAGING — DX DG CHEST 2V
2 series · 2 of 2 positions shown · non-contrast
Comparison: PA and lateral chest 01/06/2017.

CLINICAL DATA: Physical examination.

EXAM:
CHEST - 2 VIEW

[chest pa]
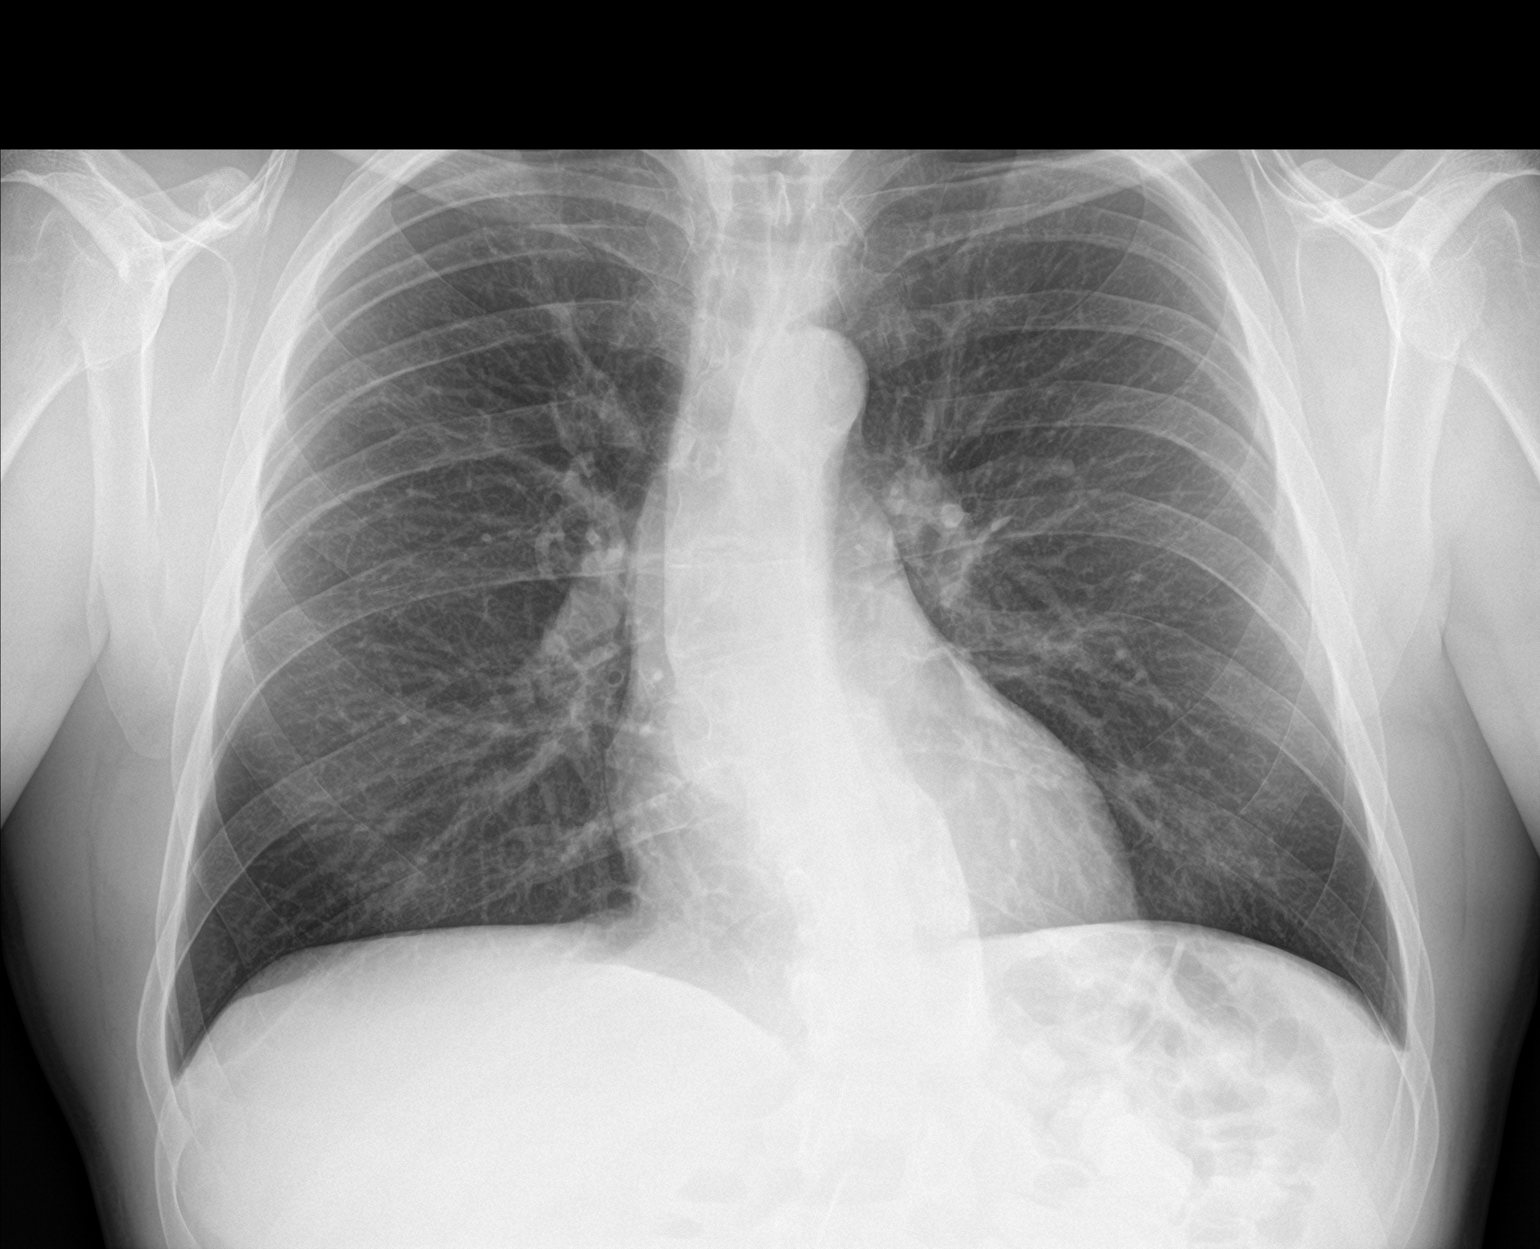

[chest lat]
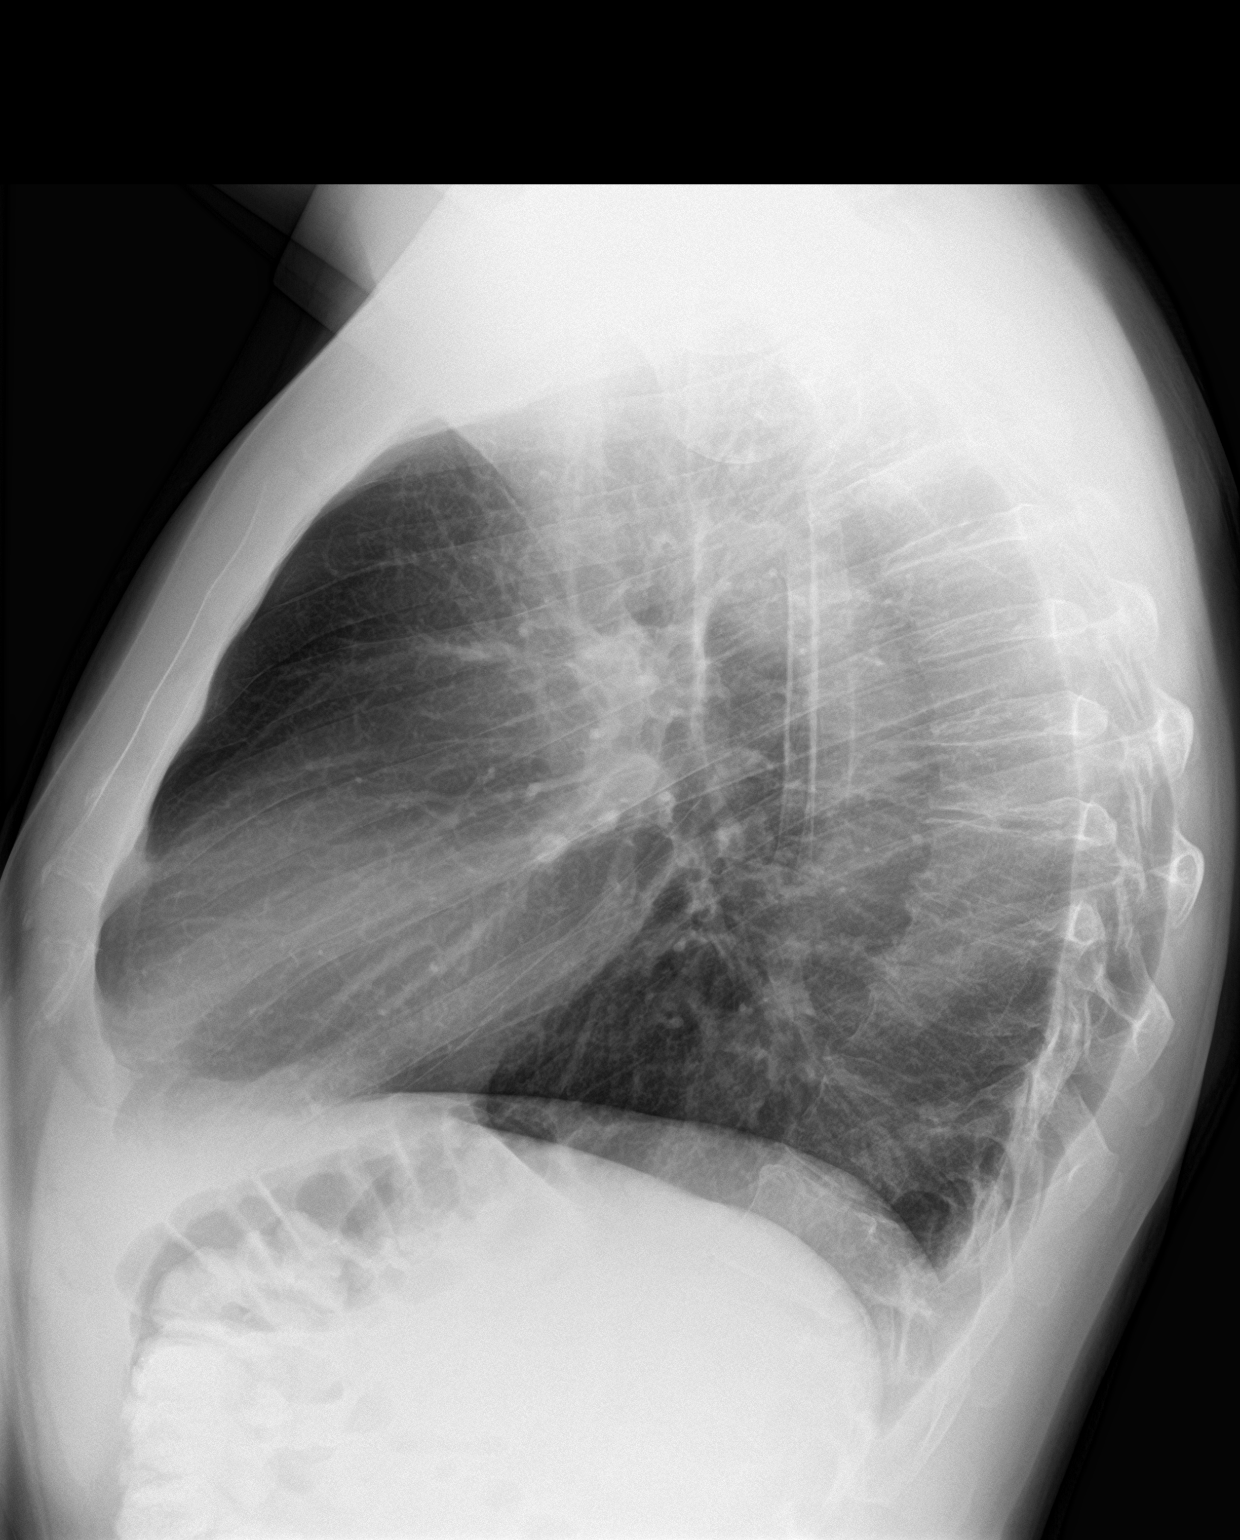

[2 of 2 positions shown; findings below may reference images not displayed]

FINDINGS: The lungs are clear. Heart size is normal. No pneumothorax or
pleural effusion. - Thoracolumbar scoliosis is identified.
IMPRESSION: No acute disease.

Scoliosis.

## 2020-02-01 ENCOUNTER — Other Ambulatory Visit: Payer: Self-pay | Admitting: Internal Medicine

## 2020-02-01 DIAGNOSIS — F411 Generalized anxiety disorder: Secondary | ICD-10-CM

## 2020-05-06 ENCOUNTER — Other Ambulatory Visit: Payer: Self-pay | Admitting: Internal Medicine

## 2020-05-06 DIAGNOSIS — N522 Drug-induced erectile dysfunction: Secondary | ICD-10-CM

## 2020-05-08 ENCOUNTER — Other Ambulatory Visit: Payer: Self-pay | Admitting: Internal Medicine

## 2020-05-08 DIAGNOSIS — F411 Generalized anxiety disorder: Secondary | ICD-10-CM

## 2020-05-08 MED ORDER — PROPRANOLOL HCL 10 MG PO TABS: 10.0000 mg | ORAL_TABLET | Freq: Every day | ORAL | 2 refills | Status: DC | PRN

## 2020-05-13 ENCOUNTER — Encounter: Payer: Self-pay | Admitting: Internal Medicine

## 2020-05-13 ENCOUNTER — Other Ambulatory Visit: Payer: Self-pay

## 2020-05-13 ENCOUNTER — Ambulatory Visit (INDEPENDENT_AMBULATORY_CARE_PROVIDER_SITE_OTHER): Payer: 59 | Admitting: Internal Medicine

## 2020-05-13 VITALS — BP 114/80 | HR 78 | Temp 98.6°F | Resp 18 | Ht 72.0 in | Wt 165.2 lb

## 2020-05-13 DIAGNOSIS — Z23 Encounter for immunization: Secondary | ICD-10-CM | POA: Diagnosis not present

## 2020-05-13 DIAGNOSIS — D539 Nutritional anemia, unspecified: Secondary | ICD-10-CM

## 2020-05-13 DIAGNOSIS — D72829 Elevated white blood cell count, unspecified: Secondary | ICD-10-CM | POA: Diagnosis not present

## 2020-05-13 DIAGNOSIS — F411 Generalized anxiety disorder: Secondary | ICD-10-CM

## 2020-05-13 DIAGNOSIS — Z0000 Encounter for general adult medical examination without abnormal findings: Secondary | ICD-10-CM | POA: Diagnosis not present

## 2020-05-13 DIAGNOSIS — K5130 Ulcerative (chronic) rectosigmoiditis without complications: Secondary | ICD-10-CM

## 2020-05-13 LAB — CBC WITH DIFFERENTIAL/PLATELET
Basophils Absolute: 0.1 10*3/uL (ref 0.0–0.1)
Basophils Relative: 0.8 % (ref 0.0–3.0)
Eosinophils Absolute: 0.6 10*3/uL (ref 0.0–0.7)
Eosinophils Relative: 6.8 % — ABNORMAL HIGH (ref 0.0–5.0)
HCT: 38.5 % — ABNORMAL LOW (ref 39.0–52.0)
Hemoglobin: 13.1 g/dL (ref 13.0–17.0)
Lymphocytes Relative: 17.6 % (ref 12.0–46.0)
Lymphs Abs: 1.5 10*3/uL (ref 0.7–4.0)
MCHC: 34.1 g/dL (ref 30.0–36.0)
MCV: 85.7 fl (ref 78.0–100.0)
Monocytes Absolute: 0.6 10*3/uL (ref 0.1–1.0)
Monocytes Relative: 6.9 % (ref 3.0–12.0)
Neutro Abs: 5.8 10*3/uL (ref 1.4–7.7)
Neutrophils Relative %: 67.9 % (ref 43.0–77.0)
Platelets: 317 10*3/uL (ref 150.0–400.0)
RBC: 4.49 Mil/uL (ref 4.22–5.81)
RDW: 13.2 % (ref 11.5–15.5)
WBC: 8.5 10*3/uL (ref 4.0–10.5)

## 2020-05-13 LAB — BASIC METABOLIC PANEL
BUN: 10 mg/dL (ref 6–23)
CO2: 30 mEq/L (ref 19–32)
Calcium: 9.9 mg/dL (ref 8.4–10.5)
Chloride: 100 mEq/L (ref 96–112)
Creatinine, Ser: 1.01 mg/dL (ref 0.40–1.50)
GFR: 95.53 mL/min (ref >60.00)
Glucose, Bld: 84 mg/dL (ref 70–99)
Potassium: 4.1 mEq/L (ref 3.5–5.1)
Sodium: 137 mEq/L (ref 135–145)

## 2020-05-13 LAB — HEPATIC FUNCTION PANEL
ALT: 17 U/L (ref 0–53)
AST: 21 U/L (ref 0–37)
Albumin: 4.5 g/dL (ref 3.5–5.2)
Alkaline Phosphatase: 61 U/L (ref 39–117)
Bilirubin, Direct: 0 mg/dL (ref 0.0–0.3)
Total Bilirubin: 0.3 mg/dL (ref 0.2–1.2)
Total Protein: 7.5 g/dL (ref 6.0–8.3)

## 2020-05-13 LAB — LIPID PANEL
Cholesterol: 164 mg/dL (ref 0–200)
HDL: 51.3 mg/dL (ref >39.00)
NonHDL: 112.42
Total CHOL/HDL Ratio: 3
Triglycerides: 216 mg/dL — ABNORMAL HIGH (ref 0.0–149.0)
VLDL: 43.2 mg/dL — ABNORMAL HIGH (ref 0.0–40.0)

## 2020-05-13 LAB — LDL CHOLESTEROL, DIRECT: Direct LDL: 101 mg/dL

## 2020-05-13 LAB — TSH: TSH: 1.51 u[IU]/mL (ref 0.35–4.50)

## 2020-05-13 NOTE — Patient Instructions (Signed)

## 2020-05-13 NOTE — Progress Notes (Signed)
Subjective:  Patient ID: Blake Chapman, male    DOB: 02/22/1986  Age: 34 y.o. MRN: [id number]  CC: Annual Exam (No concerns )  This visit occurred during the SARS-CoV-2 public health emergency.  Safety protocols were in place, including screening questions prior to the visit, additional usage of staff PPE, and extensive cleaning of exam room while observing appropriate contact time as indicated for disinfecting solutions.    HPI Blake Chapman presents for a CPX.  He tells me his inflammatory bowel disease has been relatively well controlled.  He has rare episodes of bloody diarrhea.  He denies any recent episodes of abdominal pain, weight loss, nausea, vomiting, fever, or chills.  Outpatient Medications Prior to Visit  Medication Sig Dispense Refill  . albuterol (PROVENTIL HFA;VENTOLIN HFA) 108 (90 Base) MCG/ACT inhaler Inhale 2-3 puffs into the lungs as needed for wheezing or shortness of breath.    . APRISO 0.375 g 24 hr capsule TAKE 4 CAPSULES (1.5 G TOTAL) BY MOUTH DAILY. 360 capsule 3  . Ascorbic Acid (VITAMIN C) 100 MG tablet Take 100 mg by mouth daily.    . DULoxetine (CYMBALTA) 60 MG capsule TAKE 1 CAPSULE BY MOUTH EVERY DAY 90 capsule 1  . fluticasone (FLONASE) 50 MCG/ACT nasal spray Place into both nostrils daily.    . Loperamide HCl (IMODIUM PO) Take by mouth 2 (two) times daily.    . Loratadine (CLARITIN PO) Take by mouth.    . Multiple Vitamin (MULTIVITAMIN) capsule Take 1 capsule by mouth daily.    Marland Kitchen omeprazole (PRILOSEC) 10 MG capsule Take 10 mg by mouth daily.    . sodium chloride 0.9 % SOLN 250 mL with vedolizumab 300 MG SOLR 300 mg Inject 300 mg into the vein every 6 (six) weeks.    Marland Kitchen STENDRA 200 MG TABS TAKE 1 TABLET BY MOUTH ONCE DAILY 15 TO 30 MINUTES BEFORE INTERCOURSE AS DIRECTED 6 tablet 0  . traZODone (DESYREL) 50 MG tablet Take 1 tab at bedtime as needed 90 tablet 3  . VITAMIN D, CHOLECALCIFEROL, PO Take by mouth.    . propranolol (INDERAL) 10 MG tablet Take 1  tablet (10 mg total) by mouth daily as needed. 30 tablet 2   No facility-administered medications prior to visit.    ROS Review of Systems  Constitutional: Negative.  Negative for appetite change, diaphoresis, fatigue, fever and unexpected weight change.  HENT: Negative.   Eyes: Negative for visual disturbance.  Respiratory: Negative for cough, chest tightness, shortness of breath and wheezing.   Cardiovascular: Negative for chest pain, palpitations and leg swelling.  Gastrointestinal: Positive for blood in stool. Negative for abdominal pain, constipation, diarrhea, nausea and vomiting.  Genitourinary: Negative.  Negative for difficulty urinating.  Musculoskeletal: Negative for arthralgias, back pain, myalgias and neck pain.  Skin: Negative.  Negative for color change and pallor.  Neurological: Negative.  Negative for dizziness, weakness, light-headedness and numbness.  Hematological: Negative for adenopathy. Does not bruise/bleed easily.  Psychiatric/Behavioral: Negative.     Objective:  BP 114/80   Pulse 78   Temp 98.6 F (37 C) (Oral)   Resp 18   Ht 6' (1.829 m)   Wt 165 lb 3.2 oz (74.9 kg)   SpO2 98%   BMI 22.41 kg/m   BP Readings from Last 3 Encounters:  05/13/20 114/80  03/15/19 118/88  06/25/18 113/62    Wt Readings from Last 3 Encounters:  05/13/20 165 lb 3.2 oz (74.9 kg)  03/15/19 166 lb 9.6 oz (75.6  kg)  06/25/18 171 lb 3.2 oz (77.7 kg)    Physical Exam Vitals reviewed.  Constitutional:      Appearance: Normal appearance.  HENT:     Nose: Nose normal.     Mouth/Throat:     Mouth: Mucous membranes are moist.  Eyes:     General: No scleral icterus.    Conjunctiva/sclera: Conjunctivae normal.  Cardiovascular:     Rate and Rhythm: Normal rate and regular rhythm.     Heart sounds: No murmur heard.   Pulmonary:     Effort: Pulmonary effort is normal.     Breath sounds: No stridor. No wheezing, rhonchi or rales.  Abdominal:     General: Abdomen is  flat. Bowel sounds are normal. There is no distension.     Palpations: Abdomen is soft. There is no hepatomegaly, splenomegaly or mass.     Tenderness: There is no abdominal tenderness.  Musculoskeletal:        General: Normal range of motion.     Cervical back: Neck supple.     Right lower leg: No edema.     Left lower leg: No edema.  Lymphadenopathy:     Cervical: No cervical adenopathy.  Skin:    General: Skin is warm and dry.     Findings: No rash.  Neurological:     General: No focal deficit present.     Mental Status: He is alert.  Psychiatric:        Mood and Affect: Mood normal.        Behavior: Behavior normal.     Lab Results  Component Value Date   WBC 8.5 05/13/2020   HGB 13.1 05/13/2020   HCT 38.5 (L) 05/13/2020   PLT 317.0 05/13/2020   GLUCOSE 84 05/13/2020   CHOL 164 05/13/2020   TRIG 216.0 (H) 05/13/2020   HDL 51.30 05/13/2020   LDLDIRECT 101.0 05/13/2020   LDLCALC 105 (H) 11/29/2017   ALT 17 05/13/2020   AST 21 05/13/2020   NA 137 05/13/2020   K 4.1 05/13/2020   CL 100 05/13/2020   CREATININE 1.01 05/13/2020   BUN 10 05/13/2020   CO2 30 05/13/2020   TSH 1.51 05/13/2020    DG Chest 2 View  Result Date: 12/20/2017 CLINICAL DATA:  Physical examination. EXAM: CHEST - 2 VIEW COMPARISON:  PA and lateral chest 01/06/2017. FINDINGS: The lungs are clear. Heart size is normal. No pneumothorax or pleural effusion. - Thoracolumbar scoliosis is identified. IMPRESSION: No acute disease. Scoliosis. Electronically Signed   By: Inge Rise M.D.   On: 12/20/2017 14:14    Assessment & Plan:   [name] was seen today for annual exam.  Diagnoses and all orders for this visit:  Ulcerative rectosigmoiditis without complication ([place])- He is doing really well on the monoclonal antibody.  He is very mildly anemic but his white cell count is normal now. -     CBC with Differential/Platelet; Future -     Basic metabolic panel; Future -     Hepatic function panel;  Future -     Hepatic function panel -     Basic metabolic panel -     CBC with Differential/Platelet  Routine general medical examination at a health care facility- Exam completed, labs reviewed, vaccines reviewed and updated, patient education was given. -     Lipid panel; Future -     Lipid panel  Deficiency anemia- I have asked him to return in 2 to 3 months to have  this rechecked - I will screen him for vitamin deficiencies.  Leukocytosis, unspecified type- His white cell count is normal now. -     CBC with Differential/Platelet; Future -     CBC with Differential/Platelet  GAD (generalized anxiety disorder)- He tells me his symptoms are well controlled with duloxetine and propanolol. -     TSH; Future -     TSH  Need for tetanus booster -     Tdap vaccine greater than or equal to 7yo IM  Other orders -     LDL cholesterol, direct   I am having Simona Huh maintain his multivitamin, Loratadine (CLARITIN PO), fluticasone, (VITAMIN D, CHOLECALCIFEROL, PO), omeprazole, vitamin C, albuterol, Loperamide HCl (IMODIUM PO), Apriso, sodium chloride 0.9 % SOLN 250 mL with vedolizumab 300 MG SOLR 300 mg, traZODone, DULoxetine, and Stendra.  No orders of the defined types were placed in this encounter.    Follow-up: Return in about 6 months (around 11/10/2020).  Scarlette Calico, MD

## 2020-05-14 ENCOUNTER — Other Ambulatory Visit: Payer: Self-pay

## 2020-05-14 DIAGNOSIS — F411 Generalized anxiety disorder: Secondary | ICD-10-CM

## 2020-05-14 MED ORDER — PROPRANOLOL HCL 10 MG PO TABS: 10.0000 mg | ORAL_TABLET | Freq: Every day | ORAL | 1 refills | Status: DC | PRN

## 2020-07-31 ENCOUNTER — Other Ambulatory Visit: Payer: Self-pay | Admitting: Internal Medicine

## 2020-07-31 DIAGNOSIS — F411 Generalized anxiety disorder: Secondary | ICD-10-CM

## 2020-09-29 ENCOUNTER — Ambulatory Visit (INDEPENDENT_AMBULATORY_CARE_PROVIDER_SITE_OTHER): Payer: 59 | Admitting: Gastroenterology

## 2020-09-29 ENCOUNTER — Encounter: Payer: Self-pay | Admitting: Gastroenterology

## 2020-09-29 VITALS — BP 102/72 | HR 68 | Ht 67.0 in | Wt 166.4 lb

## 2020-09-29 DIAGNOSIS — K5130 Ulcerative (chronic) rectosigmoiditis without complications: Secondary | ICD-10-CM

## 2020-09-29 NOTE — Patient Instructions (Signed)
If you are age 37 or younger, your body mass index should be between 19-25. Your Body mass index is 26.06 kg/m. If this is out of the aformentioned range listed, please consider follow up with your Primary Care Provider.   __________________________________________________________  The [facility] providers would like to encourage you to use [facility] to communicate with providers for non-urgent requests or questions.  Due to long hold times on the telephone, sending your provider a message by [facility] may be a faster and more efficient way to get a response.  Please allow 48 business hours for a response.  Please remember that this is for non-urgent requests.  ___________________________________________________________  Dennis Bast will follow up with our office in 6 months (January 2023).  We will contact you to schedule an appointment.  Thank you for entrusting me with your care and choosing [facility].  Dr Ardis Hughs

## 2020-09-29 NOTE — Progress Notes (Signed)
Review of pertinent gastrointestinal problems: 1. Distal UC;presented 2017 with intermittent rectal bleeding. Colonoscopy December 2017 Dr. Ardis Hughs found moderate inflammation from rectum up to 25 cm. The rest of the colon was normal. Terminal ileum could not be intubated. Biopsies from the inflamed segment showed chronic active inflammation.Biopsies from non-inflamed appearing segments of colon in the right and left segments were all normal. He was started on mesalamine enema and mesalamine orally.September 2018 flare, labs showed white blood cell count was 20kand I Put him on vancomycin,C. difficile by PCR was negative however. Eventual steroid pulse 40 mg once daily significantly helped him clinically.Aug 2019 flaring again, sed rate 54, required steroids again.CT scan 8/2019confirmed inflammation distal descending and sigmoid colon. Stool testing neg for infection.  New start entyvio8/2019.  Labs August 2019:HIV negative, hepatitis B negative, hepatitis Cnegative. TB quant gold indeterminant followed up by normal chest x-ray, followed up by negative PPD.  01/2019: entyvio Antibodies negative; entyvio trough drug leve 5.7; increased dosing to every 6 weeks instead of every 8   HPI: This is a very pleasant 35 year old man  I last saw him about a year and a half ago, November 2020.  His weight is exactly the same today as it was at that visit, same scale.  He has been on Entyvio every 6 weeks  Blood work January 2022 showed normal CBC, normal LFTs, normal TSH, normal basic metabolic profile.  Since that appointment he has been very good about his Entyvio every 6 weeks.  He says overall he is doing quite well.  He does still have more frequent stools that he did 5 years ago.  He probably goes 2-3 times a day most days.  Once in a while he will have a urgency.  He never sees blood in his stool.  He has no abdominal pains.  He takes an Imodium every morning and also at bedtime every  night.  He significantly cut back on his caffeine intake by at least 50% but clearly still drinks quite a bit of caffeine in the morning and in the p.m. hours.  He has cut back on dairy.   ROS: complete GI ROS as described in HPI, all other review negative.  Constitutional:  No unintentional weight loss   Past Medical History:  Diagnosis Date  . Allergy   . Anxiety   . Depression   . GERD (gastroesophageal reflux disease)   . Neuromuscular disorder ([place])    acute bil lower legs neuropathy  . Substance abuse ([place])    alcohol - previous    Past Surgical History:  Procedure Laterality Date  . clavical - right repair     2010  . HARDWARE REMOVAL  2011  . HERNIA REPAIR  1991    Current Outpatient Medications  Medication Sig Dispense Refill  . Ascorbic Acid (VITAMIN C) 100 MG tablet Take 100 mg by mouth daily.    . DULoxetine (CYMBALTA) 60 MG capsule TAKE 1 CAPSULE BY MOUTH EVERY DAY 90 capsule 1  . fluticasone (FLONASE) 50 MCG/ACT nasal spray Place into both nostrils daily.    . Loperamide HCl (IMODIUM PO) Take by mouth 2 (two) times daily.    . Loratadine (CLARITIN PO) Take by mouth.    . Multiple Vitamin (MULTIVITAMIN) capsule Take 1 capsule by mouth daily.    Marland Kitchen omeprazole (PRILOSEC) 10 MG capsule Take 10 mg by mouth daily.    . propranolol (INDERAL) 10 MG tablet Take 1 tablet (10 mg total) by mouth daily as  needed. 90 tablet 1  . sodium chloride 0.9 % SOLN 250 mL with vedolizumab 300 MG SOLR 300 mg Inject 300 mg into the vein every 6 (six) weeks.    Marland Kitchen STENDRA 200 MG TABS TAKE 1 TABLET BY MOUTH ONCE DAILY 15 TO 30 MINUTES BEFORE INTERCOURSE AS DIRECTED 6 tablet 0  . traZODone (DESYREL) 50 MG tablet Take 1 tab at bedtime as needed 90 tablet 3  . VITAMIN D, CHOLECALCIFEROL, PO Take by mouth.     No current facility-administered medications for this visit.    Allergies as of 09/29/2020  . (No Known Allergies)    Family History  Problem Relation Age of Onset  . Thyroid  cancer Mother   . Breast cancer Maternal Grandmother   . Colon cancer Maternal Grandmother 44       died 2 weeks later  . Prostate cancer Maternal Uncle   . Esophageal cancer Maternal Grandfather        died mid 31's  . Cancer Neg Hx   . Alcohol abuse Neg Hx   . Depression Neg Hx   . Drug abuse Neg Hx   . Early death Neg Hx   . Stroke Neg Hx   . Kidney disease Neg Hx   . Hypertension Neg Hx   . Hyperlipidemia Neg Hx   . Pancreatic cancer Neg Hx   . Rectal cancer Neg Hx   . Stomach cancer Neg Hx     Social History   Socioeconomic History  . Marital status: Single    Spouse name: Not on file  . Number of children: 0  . Years of education: Not on file  . Highest education level: Not on file  Occupational History  . Occupation: Attorney  Tobacco Use  . Smoking status: Light Tobacco Smoker    Types: Cigars  . Smokeless tobacco: Never Used  . Tobacco comment: 1 per day  Vaping Use  . Vaping Use: Never used  Substance and Sexual Activity  . Alcohol use: No    Alcohol/week: 0.0 standard drinks    Comment: Alcoholism- remission - Oct 2015  . Drug use: No  . Sexual activity: Yes    Partners: Female    Birth control/protection: Condom  Other Topics Concern  . Not on file  Social History Narrative  . Not on file   Social Determinants of Health   Financial Resource Strain: Not on file  Food Insecurity: Not on file  Transportation Needs: Not on file  Physical Activity: Not on file  Stress: Not on file  Social Connections: Not on file  Intimate Partner Violence: Not on file     Physical Exam: BP 102/72 (BP Location: Left Arm, Patient Position: Sitting, Cuff Size: Normal)   Pulse 68   Ht 5' 7"  (1.702 m) Comment: height measured without shoes  Wt 166 lb 6 oz (75.5 kg)   BMI 26.06 kg/m  Constitutional: generally well-appearing Psychiatric: alert and oriented x3 Abdomen: soft, nontender, nondistended, no obvious ascites, no peritoneal signs, normal bowel  sounds No peripheral edema noted in lower extremities  Assessment and plan: 35 y.o. male with left-sided ulcerative colitis  Symptoms seem under quite good control overall but not perfect yet.  I explained to him that before any medicine changes I would probably want to look again in his colon, it has been 5 years and I would like to restage his disease.  He is not sure if he wants to do that just now.  In  the end we decided that he would return to see me in 6 months and we will go from there.  He will try possibly to cut back even further on his caffeine intake.  Please see the "Patient Instructions" section for addition details about the plan.  Owens Loffler, MD [facility] 09/29/2020, 10:30 AM   Total time on date of encounter was 25 minutes (this included time spent preparing to see the patient reviewing records; obtaining and/or reviewing separately obtained history; performing a medically appropriate exam and/or evaluation; counseling and educating the patient and family if present; ordering medications, tests or procedures if applicable; and documenting clinical information in the health record).

## 2020-11-12 ENCOUNTER — Telehealth: Payer: Self-pay

## 2020-11-12 ENCOUNTER — Other Ambulatory Visit: Payer: Self-pay | Admitting: Internal Medicine

## 2020-11-12 DIAGNOSIS — F411 Generalized anxiety disorder: Secondary | ICD-10-CM

## 2020-11-12 MED ORDER — TRAZODONE HCL 50 MG PO TABS: ORAL_TABLET | ORAL | 1 refills | Status: DC

## 2020-11-12 NOTE — Telephone Encounter (Signed)
Would you like me to schedule pt in a buffer? He is due for his 6mofollow up but do not have an upcoming OV scheduled. Please advise.

## 2020-11-12 NOTE — Telephone Encounter (Signed)
pt has stated when he went to refill his traZODone (DESYREL) 50 MG tablet and was informed by the pharmacy that the rx from Dr. Ronnald Ramp was not on file. Pt is asking a rx refill please be sent in.  **Sent to [facility].

## 2021-01-13 ENCOUNTER — Other Ambulatory Visit: Payer: Self-pay | Admitting: Internal Medicine

## 2021-01-13 DIAGNOSIS — F411 Generalized anxiety disorder: Secondary | ICD-10-CM

## 2021-02-08 ENCOUNTER — Other Ambulatory Visit: Payer: Self-pay | Admitting: Internal Medicine

## 2021-02-08 DIAGNOSIS — N522 Drug-induced erectile dysfunction: Secondary | ICD-10-CM

## 2021-02-21 ENCOUNTER — Encounter: Payer: Self-pay | Admitting: Internal Medicine

## 2021-02-21 NOTE — Patient Instructions (Addendum)
     Medications changes include :   none   Your prescription(s) have been submitted to your pharmacy. Please take as directed and contact our office if you believe you are having problem(s) with the medication(s).     Please followup in 6 months

## 2021-02-21 NOTE — Progress Notes (Signed)
Subjective:    Patient ID: Blake Chapman, male    DOB: 12-29-1985, 35 y.o.   MRN: [id number]  This visit occurred during the SARS-CoV-2 public health emergency.  Safety protocols were in place, including screening questions prior to the visit, additional usage of staff PPE, and extensive cleaning of exam room while observing appropriate contact time as indicated for disinfecting solutions.     HPI The patient is here for follow up of their chronic medical problems, including GAD on duloxetine and propranolol, sleep difficulties  He is taking his medication daily as prescribed.  Has no concerns.  He feels his medications are working well.  He does need a refill of the [place].  Medications and allergies reviewed with patient and updated if appropriate.  Patient Active Problem List   Diagnosis Date Noted   Deficiency anemia 11/29/2017   Drug-induced erectile dysfunction 11/29/2017   Leukocytosis 11/18/2016   UC (ulcerative colitis) ([place]) 11/17/2016   GAD (generalized anxiety disorder) 06/12/2014   Alcohol abuse, in remission 06/12/2014   Routine general medical examination at a health care facility 06/12/2014    Current Outpatient Medications on File Prior to Visit  Medication Sig Dispense Refill   Ascorbic Acid (VITAMIN C) 100 MG tablet Take 100 mg by mouth daily.     DULoxetine (CYMBALTA) 60 MG capsule TAKE 1 CAPSULE BY MOUTH EVERY DAY 90 capsule 1   fluticasone (FLONASE) 50 MCG/ACT nasal spray Place into both nostrils daily.     Loperamide HCl (IMODIUM PO) Take by mouth 2 (two) times daily.     Loratadine (CLARITIN PO) Take by mouth.     Multiple Vitamin (MULTIVITAMIN) capsule Take 1 capsule by mouth daily.     omeprazole (PRILOSEC) 10 MG capsule Take 10 mg by mouth daily.     propranolol (INDERAL) 10 MG tablet Take 1 tablet (10 mg total) by mouth daily as needed. 90 tablet 1   sodium chloride 0.9 % SOLN 250 mL with vedolizumab 300 MG SOLR 300 mg Inject 300 mg into the  vein every 6 (six) weeks.     STENDRA 200 MG TABS TAKE 1 TABLET BY MOUTH ONCE DAILY 15 TO 30 MINUTES BEFORE INTERCOURSE AS DIRECTED 6 tablet 0   traZODone (DESYREL) 50 MG tablet Take 1 tab at bedtime as needed 90 tablet 1   VITAMIN D, CHOLECALCIFEROL, PO Take by mouth.     No current facility-administered medications on file prior to visit.    Past Medical History:  Diagnosis Date   Allergy    Anxiety    Depression    GERD (gastroesophageal reflux disease)    Neuromuscular disorder ([facility])    acute bil lower legs neuropathy   Substance abuse ([place])    alcohol - previous    Past Surgical History:  Procedure Laterality Date   clavical - right repair     2010   HARDWARE REMOVAL  2011   HERNIA REPAIR  1991    Social History   Socioeconomic History   Marital status: Single    Spouse name: Not on file   Number of children: 0   Years of education: Not on file   Highest education level: Not on file  Occupational History   Occupation: Attorney  Tobacco Use   Smoking status: Light Smoker    Types: Cigars   Smokeless tobacco: Never   Tobacco comments:    1 per day  Vaping Use   Vaping Use: Never used  Substance  and Sexual Activity   Alcohol use: No    Alcohol/week: 0.0 standard drinks    Comment: Alcoholism- remission - Oct 2015   Drug use: No   Sexual activity: Yes    Partners: Female    Birth control/protection: Condom  Other Topics Concern   Not on file  Social History Narrative   Not on file   Social Determinants of Health   Financial Resource Strain: Not on file  Food Insecurity: Not on file  Transportation Needs: Not on file  Physical Activity: Not on file  Stress: Not on file  Social Connections: Not on file    Family History  Problem Relation Age of Onset   Thyroid cancer Mother    Breast cancer Maternal Grandmother    Colon cancer Maternal Grandmother 97       died 2 weeks later   Prostate cancer Maternal Uncle    Esophageal cancer Maternal  Grandfather        died mid 26's   Cancer Neg Hx    Alcohol abuse Neg Hx    Depression Neg Hx    Drug abuse Neg Hx    Early death Neg Hx    Stroke Neg Hx    Kidney disease Neg Hx    Hypertension Neg Hx    Hyperlipidemia Neg Hx    Pancreatic cancer Neg Hx    Rectal cancer Neg Hx    Stomach cancer Neg Hx     Review of Systems  Constitutional:  Negative for chills and fever.  Respiratory:  Negative for cough, shortness of breath and wheezing.   Cardiovascular:  Negative for chest pain, palpitations and leg swelling.  Neurological:  Negative for dizziness, light-headedness and headaches.      Objective:   Vitals:   02/22/21 1102  BP: 108/78  Pulse: 67  Temp: 98.7 F (37.1 C)  SpO2: 98%   BP Readings from Last 3 Encounters:  02/22/21 108/78  09/29/20 102/72  05/13/20 114/80   Wt Readings from Last 3 Encounters:  02/22/21 172 lb (78 kg)  09/29/20 166 lb 6 oz (75.5 kg)  05/13/20 165 lb 3.2 oz (74.9 kg)   Body mass index is 26.94 kg/m.   Physical Exam    Constitutional: Appears well-developed and well-nourished. No distress.  HENT:  Head: Normocephalic and atraumatic.  Neck: Neck supple. No tracheal deviation present. No thyromegaly present.  No cervical lymphadenopathy Cardiovascular: Normal rate, regular rhythm and normal heart sounds.   No murmur heard. No carotid bruit .  No edema Pulmonary/Chest: Effort normal and breath sounds normal. No respiratory distress. No has no wheezes. No rales.  Skin: Skin is warm and dry. Not diaphoretic.  Psychiatric: Normal mood and affect. Behavior is normal.      Assessment & Plan:    Drug-induced erectile dysfunction: Chronic Blake Chapman has been effective and he would like a refill Refill sent-Stendra 200 mg once daily 15-20 minutes before intercourse  Generalized anxiety disorder: Chronic Well-controlled Continue duloxetine 60 mg daily, propranolol 10 mg daily as needed  Sleep difficulties: Chronic Continue  trazodone 50 mg nightly as needed  Follow-up with PCP in 6 months

## 2021-02-22 ENCOUNTER — Other Ambulatory Visit: Payer: Self-pay

## 2021-02-22 ENCOUNTER — Ambulatory Visit (INDEPENDENT_AMBULATORY_CARE_PROVIDER_SITE_OTHER): Payer: 59 | Admitting: Internal Medicine

## 2021-02-22 VITALS — BP 108/78 | HR 67 | Temp 98.7°F | Ht 67.0 in | Wt 172.0 lb

## 2021-02-22 DIAGNOSIS — G479 Sleep disorder, unspecified: Secondary | ICD-10-CM

## 2021-02-22 DIAGNOSIS — F411 Generalized anxiety disorder: Secondary | ICD-10-CM | POA: Diagnosis not present

## 2021-02-22 DIAGNOSIS — N522 Drug-induced erectile dysfunction: Secondary | ICD-10-CM | POA: Diagnosis not present

## 2021-02-22 MED ORDER — STENDRA 200 MG PO TABS: ORAL_TABLET | ORAL | 5 refills | Status: DC

## 2021-04-21 ENCOUNTER — Other Ambulatory Visit: Payer: Self-pay | Admitting: Internal Medicine

## 2021-04-21 DIAGNOSIS — F411 Generalized anxiety disorder: Secondary | ICD-10-CM

## 2021-05-05 ENCOUNTER — Other Ambulatory Visit: Payer: Self-pay | Admitting: Internal Medicine

## 2021-05-05 ENCOUNTER — Ambulatory Visit: Payer: 59 | Admitting: Gastroenterology

## 2021-05-05 DIAGNOSIS — F411 Generalized anxiety disorder: Secondary | ICD-10-CM

## 2021-05-11 ENCOUNTER — Telehealth: Payer: Self-pay | Admitting: Gastroenterology

## 2021-05-11 ENCOUNTER — Other Ambulatory Visit (INDEPENDENT_AMBULATORY_CARE_PROVIDER_SITE_OTHER): Payer: 59

## 2021-05-11 DIAGNOSIS — K5130 Ulcerative (chronic) rectosigmoiditis without complications: Secondary | ICD-10-CM | POA: Diagnosis not present

## 2021-05-11 LAB — CBC WITH DIFFERENTIAL/PLATELET
Basophils Absolute: 0 10*3/uL (ref 0.0–0.1)
Basophils Relative: 0.1 % (ref 0.0–3.0)
Eosinophils Absolute: 0 10*3/uL (ref 0.0–0.7)
Eosinophils Relative: 0 % (ref 0.0–5.0)
HCT: 35.4 % — ABNORMAL LOW (ref 39.0–52.0)
Hemoglobin: 11.8 g/dL — ABNORMAL LOW (ref 13.0–17.0)
Lymphocytes Relative: 4.8 % — ABNORMAL LOW (ref 12.0–46.0)
Lymphs Abs: 0.4 10*3/uL — ABNORMAL LOW (ref 0.7–4.0)
MCHC: 33.4 g/dL (ref 30.0–36.0)
MCV: 86.1 fl (ref 78.0–100.0)
Monocytes Absolute: 0.2 10*3/uL (ref 0.1–1.0)
Monocytes Relative: 2.2 % — ABNORMAL LOW (ref 3.0–12.0)
Neutro Abs: 8.1 10*3/uL — ABNORMAL HIGH (ref 1.4–7.7)
Neutrophils Relative %: 92.9 % — ABNORMAL HIGH (ref 43.0–77.0)
Platelets: 235 10*3/uL (ref 150.0–400.0)
RBC: 4.11 Mil/uL — ABNORMAL LOW (ref 4.22–5.81)
RDW: 12.7 % (ref 11.5–15.5)
WBC: 8.7 10*3/uL (ref 4.0–10.5)

## 2021-05-11 LAB — SEDIMENTATION RATE: Sed Rate: 30 mm/hr — ABNORMAL HIGH (ref 0–15)

## 2021-05-11 LAB — COMPREHENSIVE METABOLIC PANEL
ALT: 29 U/L (ref 0–53)
AST: 28 U/L (ref 0–37)
Albumin: 4 g/dL (ref 3.5–5.2)
Alkaline Phosphatase: 58 U/L (ref 39–117)
BUN: 11 mg/dL (ref 6–23)
CO2: 27 mEq/L (ref 19–32)
Calcium: 9.1 mg/dL (ref 8.4–10.5)
Chloride: 102 mEq/L (ref 96–112)
Creatinine, Ser: 0.93 mg/dL (ref 0.40–1.50)
GFR: 104.74 mL/min (ref >60.00)
Glucose, Bld: 130 mg/dL — ABNORMAL HIGH (ref 70–99)
Potassium: 3.8 mEq/L (ref 3.5–5.1)
Sodium: 139 mEq/L (ref 135–145)
Total Bilirubin: 0.3 mg/dL (ref 0.2–1.2)
Total Protein: 7 g/dL (ref 6.0–8.3)

## 2021-05-11 LAB — HIGH SENSITIVITY CRP: CRP, High Sensitivity: 102.86 mg/L — ABNORMAL HIGH (ref 0.000–5.000)

## 2021-05-11 MED ORDER — PREDNISONE 20 MG PO TABS: 20.0000 mg | ORAL_TABLET | Freq: Every day | ORAL | 2 refills | Status: DC

## 2021-05-11 NOTE — Telephone Encounter (Signed)
Patient called states he is having a flare up and is requesting if he can get Prednisone please advise.

## 2021-05-11 NOTE — Telephone Encounter (Signed)
The pt has a history of Ulcerative recto sigmoiditis he in on Entyvio currently every 6 weeks.  On Sunday he had some discomfort in the abd- he had a large BM that was loose with blood and mucous.  No fever.  Covid negative. He does report chills and fatigue.   Entyvio last dose was this past Tuesday.  He states he has about 12 episodes of having to use the restroom but most are just blood and mucous with only 3-4 being actual stool.  Please advise if you want labs/ prednisone?

## 2021-05-11 NOTE — Telephone Encounter (Signed)
The labs have been ordered and prednisone sent to the pharmacy.  The pt has been advised and will come in later today for labs.  He is aware to keep appt as planned and we will contact him when results are reviewed.

## 2021-05-12 ENCOUNTER — Other Ambulatory Visit: Payer: 59

## 2021-05-12 DIAGNOSIS — K5130 Ulcerative (chronic) rectosigmoiditis without complications: Secondary | ICD-10-CM

## 2021-05-13 LAB — GI PROFILE, STOOL, PCR

## 2021-06-23 ENCOUNTER — Telehealth: Payer: Self-pay

## 2021-06-23 ENCOUNTER — Ambulatory Visit (INDEPENDENT_AMBULATORY_CARE_PROVIDER_SITE_OTHER): Payer: 59 | Admitting: Gastroenterology

## 2021-06-23 ENCOUNTER — Other Ambulatory Visit: Payer: 59

## 2021-06-23 ENCOUNTER — Encounter: Payer: Self-pay | Admitting: Gastroenterology

## 2021-06-23 VITALS — BP 122/88 | HR 105 | Ht 69.0 in | Wt 166.1 lb

## 2021-06-23 DIAGNOSIS — K5130 Ulcerative (chronic) rectosigmoiditis without complications: Secondary | ICD-10-CM

## 2021-06-23 MED ORDER — NA SULFATE-K SULFATE-MG SULF 17.5-3.13-1.6 GM/177ML PO SOLN: 1.0000 | ORAL | 0 refills | Status: DC

## 2021-06-23 MED ORDER — HUMIRA (2 PEN) 40 MG/0.4ML ~~LOC~~ AJKT
1.0000 | AUTO-INJECTOR | SUBCUTANEOUS | 6 refills | Status: DC
Start: 2021-06-23 — End: 2022-03-14

## 2021-06-23 MED ORDER — PREDNISONE 20 MG PO TABS: 40.0000 mg | ORAL_TABLET | Freq: Every day | ORAL | 6 refills | Status: AC

## 2021-06-23 NOTE — Progress Notes (Signed)
Review of pertinent gastrointestinal problems: ?1. Distal UC; presented 2017 with intermittent rectal bleeding. Colonoscopy December 2017 Dr. Ardis Hughs found moderate inflammation from rectum up to 25 cm. The rest of the colon was normal. Terminal ileum could not be intubated. Biopsies from the inflamed segment showed chronic active inflammation. Biopsies from non-inflamed appearing segments of colon in the right and left segments were all normal. He was started on mesalamine enema and mesalamine orally.  September 2018 flare, labs showed white blood cell count was 20k and I Put him on vancomycin, C. difficile by PCR was negative however. Eventual steroid pulse 40 mg once daily significantly helped him clinically. Aug 2019 flaring again, sed rate 54, required steroids again.  CT scan 11/2017 confirmed inflammation distal descending and sigmoid colon.  Stool testing neg for infection. ?New start entyvio 11/2017. ?Labs August 2019: HIV negative, hepatitis B negative, hepatitis C negative.  TB quant gold indeterminant followed up by normal chest x-ray, followed up by negative PPD. ?01/2019: entyvio Antibodies negative; entyvio trough drug leve 5.7; increased dosing to every 6 weeks instead of every 8 ? ? ?HPI: ?This is a very pleasant 36 year old man ? ?He has left-sided ulcerative colitis.  Started having a flare 6 or 8 weeks ago despite Entyvio every 6 weeks.  Work-up below showed obvious inflammation and no sign of infection.  I started him on medium dose prednisone 20 mg once daily and this has definitely helped but he is still bothered by loose frequent stools.  No bleeding.  He has had some incontinence.  He has a lot of urgency.  He has more good days and bad days but he can certainly have quite a few bad days per week.  He has been on 20 mg prednisone for about 6 weeks now.  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 ?Lab work January 2023 GI pathogen panel was negative, CBC was noral except  for hemoglobin 11, CRP was very elevated at 102, sed rate was elevated at 30, complete metabolic profile was normal. ? ?His weight is exactly the same as it was 8 or 9 months ago. ? ? ?ROS: complete GI ROS as described in HPI, all other review negative. ? ?Constitutional:  No unintentional weight loss ? ? ?Past Medical History:  ?Diagnosis Date  ? Allergy   ? Anxiety   ? Depression   ? GERD (gastroesophageal reflux disease)   ? Neuromuscular disorder ([place])   ? acute bil lower legs neuropathy  ? Substance abuse ([place])   ? alcohol - previous  ? ? ?Past Surgical History:  ?Procedure Laterality Date  ? clavical - right repair    ? 2010  ? [place] REMOVAL  2011  ? HERNIA REPAIR  1991  ? ? ?Current Outpatient Medications  ?Medication Instructions  ? Avanafil (STENDRA) 200 MG TABS TAKE 1 TABLET BY MOUTH ONCE DAILY 15 TO 30 MINUTES BEFORE INTERCOURSE AS DIRECTED  ? DULoxetine (CYMBALTA) 60 MG capsule TAKE 1 CAPSULE BY MOUTH EVERY DAY  ? fluticasone (FLONASE) 50 MCG/ACT nasal spray Each Nare, Daily  ? Loperamide HCl (IMODIUM PO) Oral, 2 times daily  ? Loratadine (CLARITIN PO) Oral  ? Multiple Vitamin (MULTIVITAMIN) capsule 1 capsule, Oral, Daily  ? omeprazole (PRILOSEC) 10 mg, Oral, Daily  ? propranolol (INDERAL) 10 mg, Oral, Daily PRN  ? sodium chloride 0.9 % SOLN 250 mL with vedolizumab 300 MG SOLR 300 mg 300 mg, Intravenous, Every 6 weeks  ? traZODone (DESYREL)  50 MG tablet TAKE 1 TABLET BY MOUTH AT BEDTIME AS NEEDED  ? vitamin C 100 mg, Oral, Daily  ? VITAMIN D, CHOLECALCIFEROL, PO Oral  ? ? ?Allergies as of 06/23/2021  ? (No Known Allergies)  ? ? ?Family History  ?Problem Relation Age of Onset  ? Thyroid cancer Mother   ? Breast cancer Maternal Grandmother   ? Colon cancer Maternal Grandmother 1  ?     died 2 weeks later  ? Prostate cancer Maternal Uncle   ? Esophageal cancer Maternal Grandfather   ?     died mid 63's  ? Cancer Neg Hx   ? Alcohol abuse Neg Hx   ? Depression Neg Hx   ? Drug abuse Neg Hx   ? Early death  Neg Hx   ? Stroke Neg Hx   ? Kidney disease Neg Hx   ? Hypertension Neg Hx   ? Hyperlipidemia Neg Hx   ? Pancreatic cancer Neg Hx   ? Rectal cancer Neg Hx   ? Stomach cancer Neg Hx   ? ? ?Social History  ? ?Socioeconomic History  ? Marital status: Single  ?  Spouse name: Not on file  ? Number of children: 0  ? Years of education: Not on file  ? Highest education level: Not on file  ?Occupational History  ? Occupation: Forensic psychologist  ?Tobacco Use  ? Smoking status: Light Smoker  ?  Types: Cigars  ? Smokeless tobacco: Never  ? Tobacco comments:  ?  1 per day  ?Vaping Use  ? Vaping Use: Never used  ?Substance and Sexual Activity  ? Alcohol use: No  ?  Alcohol/week: 0.0 standard drinks  ?  Comment: Alcoholism- remission - Oct 2015  ? Drug use: No  ? Sexual activity: Yes  ?  Partners: Female  ?  Birth control/protection: Condom  ?Other Topics Concern  ? Not on file  ?Social History Narrative  ? Not on file  ? ?Social Determinants of Health  ? ?Financial Resource Strain: Not on file  ?Food Insecurity: Not on file  ?Transportation Needs: Not on file  ?Physical Activity: Not on file  ?Stress: Not on file  ?Social Connections: Not on file  ?Intimate Partner Violence: Not on file  ? ? ? ?Physical Exam: ?BP 122/88 (BP Location: Left Arm, Patient Position: Sitting, Cuff Size: Normal)   Pulse (!) 105   Ht 5' 9"  (1.753 m)   Wt 166 lb 2 oz (75.4 kg)   BMI 24.53 kg/m?  ?Constitutional: generally well-appearing ?Psychiatric: alert and oriented x3 ?Abdomen: soft, nontender, nondistended, no obvious ascites, no peritoneal signs, normal bowel sounds ?No peripheral edema noted in lower extremities ? ?Assessment and plan: ?36 y.o. male with left-sided ulcerative colitis, flaring ? ?Prednisone 20 mg once daily is improving his symptoms but not completely by any means.  His flare has started despite Entyvio already at at increased dose frequency, every 6 weeks instead of every 8.  I am going to turn his prednisone up to 40 mg once daily, we  are going to arrange a colonoscopy at his soonest convenience and I am going to start the preauthorization process to change him from Entyvio to TNF inhibitor Humira at usual starting dose with a goal of 40 mg every other week. ? ?Please see the "Patient Instructions" section for addition details about the plan. ? ?Owens Loffler, MD ?[facility] ?06/23/2021, 2:26 PM ? ? ?Total time on date of encounter was 25 minutes (this included  time spent preparing to see the patient reviewing records; obtaining and/or reviewing separately obtained history; performing a medically appropriate exam and/or evaluation; counseling and educating the patient and family if present; ordering medications, tests or procedures if applicable; and documenting clinical information in the health record). ? ? ? ?

## 2021-06-23 NOTE — Patient Instructions (Addendum)
If you are age 38 or younger, your body mass index should be between 19-25. Your Body mass index is 24.53 kg/m?Marland Kitchen If this is out of the aformentioned range listed, please consider follow up with your Primary Care Provider.  ?_______________________________________________________ ? ?The [facility] providers would like to encourage you to use [facility] to communicate with providers for non-urgent requests or questions.  Due to long hold times on the telephone, sending your provider a message by [facility] may be a faster and more efficient way to get a response.  Please allow 48 business hours for a response.  Please remember that this is for non-urgent requests.  ?_______________________________________________________ ? ?Your provider has requested that you go to the basement level for lab work before leaving today. Press "B" on the elevator. The lab is located at the first door on the left as you exit the elevator. ? ?We have sent the following medications to your pharmacy for you to pick up at your convenience: ?START: Prednisone 74m one tablet daily ? ?You have been scheduled for a colonoscopy. Please follow written instructions given to you at your visit today.  ?Please pick up your prep supplies at the pharmacy within the next 1-3 days. ?If you use inhalers (even only as needed), please bring them with you on the day of your procedure. ? ?Due to recent changes in healthcare laws, you may see the results of your imaging and laboratory studies on MyChart before your provider has had a chance to review them.  We understand that in some cases there may be results that are confusing or concerning to you. Not all laboratory results come back in the same time frame and the provider may be waiting for multiple results in order to interpret others.  Please give [id number] hours in order for your provider to thoroughly review all the results before contacting the office for clarification of your results.  ? ?Thank you for  entrusting me with your care and [facility] ? ?Dr JArdis Hughs? ? ? ? ? ?

## 2021-06-23 NOTE — Telephone Encounter (Signed)
-----   Message from [place] sent at 06/23/2021  3:34 PM EST ----- ?Regarding: Humira start ?Dr Ardis Hughs would like patient to change Entyvio to Humira at usual starting dose with a goal of 40 mg every other week. ?? ? ?

## 2021-06-23 NOTE — Telephone Encounter (Signed)
Humira prescription sent to [facility].  Records also faxed  ?

## 2021-06-27 LAB — QUANTIFERON-TB GOLD PLUS
Mitogen-NIL: >10 IU/mL
NIL: 0.02 IU/mL
QuantiFERON-TB Gold Plus: NEGATIVE
TB1-NIL: 0 IU/mL
TB2-NIL: 0 IU/mL

## 2021-07-07 ENCOUNTER — Encounter: Payer: 59 | Admitting: Gastroenterology

## 2021-07-14 ENCOUNTER — Encounter: Payer: Self-pay | Admitting: Gastroenterology

## 2021-07-20 ENCOUNTER — Encounter: Payer: Self-pay | Admitting: Gastroenterology

## 2021-07-20 ENCOUNTER — Ambulatory Visit (AMBULATORY_SURGERY_CENTER): Payer: 59 | Admitting: Gastroenterology

## 2021-07-20 ENCOUNTER — Telehealth: Payer: Self-pay

## 2021-07-20 VITALS — BP 112/76 | HR 74 | Temp 98.7°F | Resp 13 | Ht 69.0 in | Wt 166.0 lb

## 2021-07-20 DIAGNOSIS — K5140 Inflammatory polyps of colon without complications: Secondary | ICD-10-CM | POA: Diagnosis not present

## 2021-07-20 DIAGNOSIS — K5130 Ulcerative (chronic) rectosigmoiditis without complications: Secondary | ICD-10-CM

## 2021-07-20 DIAGNOSIS — K5190 Ulcerative colitis, unspecified, without complications: Secondary | ICD-10-CM | POA: Diagnosis not present

## 2021-07-20 DIAGNOSIS — K529 Noninfective gastroenteritis and colitis, unspecified: Secondary | ICD-10-CM | POA: Diagnosis not present

## 2021-07-20 MED ORDER — SODIUM CHLORIDE 0.9 % IV SOLN: 500.0000 mL | Freq: Once | INTRAVENOUS | Status: DC

## 2021-07-20 NOTE — Telephone Encounter (Signed)
I have called to inquire with [facility] the status of the prescription and to make them aware pt should be started asap.   ? ?Left message on machine to call back  ? ? ?Milus Banister, MD  Timothy Lasso, RN ?No, good catch.  ? ?Should be for Blake Chapman who I just did colonsocopy for today.  ? ?Thanks   ?  ?   ?Previous Messages ?  ?----- Message -----  ?From: Timothy Lasso, RN  ?Sent: 07/20/2021  10:08 AM EDT  ?To: Milus Banister, MD  ? ?Is this the correct patient?  ?----- Message -----  ?From: Milus Banister, MD  ?Sent: 07/20/2021   9:58 AM EDT  ?To: Timothy Lasso, RN  ? ?Not sure if you're working on it, but I think his humira was pre-auth'ed already and i'd like him to start it at usual loading protocolol asap.  ? ?Thanks  ?  ?

## 2021-07-20 NOTE — Progress Notes (Signed)
Called to room to assist during endoscopic procedure.  Patient ID and intended procedure confirmed with present staff. Received instructions for my participation in the procedure from the performing physician.  

## 2021-07-20 NOTE — Patient Instructions (Signed)
Discharge instructions given. ?Biopsies taken. ?Resume present medications. ?Dr. Ardis Hughs office to start humira at standard loading, maintainence refgimen. ?Office visit with Dr. Ardis Hughs in 6 weeks office will call to schedule. ?YOU HAD AN ENDOSCOPIC PROCEDURE TODAY AT [facility]:   Refer to the procedure report that was given to you for any specific questions about what was found during the examination.  If the procedure report does not answer your questions, please call your gastroenterologist to clarify.  If you requested that your care partner not be given the details of your procedure findings, then the procedure report has been included in a sealed envelope for you to review at your convenience later. ? ?YOU SHOULD EXPECT: Some feelings of bloating in the abdomen. Passage of more gas than usual.  Walking can help get rid of the air that was put into your GI tract during the procedure and reduce the bloating. If you had a lower endoscopy (such as a colonoscopy or flexible sigmoidoscopy) you may notice spotting of blood in your stool or on the toilet paper. If you underwent a bowel prep for your procedure, you may not have a normal bowel movement for a few days. ? ?Please Note:  You might notice some irritation and congestion in your nose or some drainage.  This is from the oxygen used during your procedure.  There is no need for concern and it should clear up in a day or so. ? ?SYMPTOMS TO REPORT IMMEDIATELY: ? ?Following lower endoscopy (colonoscopy or flexible sigmoidoscopy): ? Excessive amounts of blood in the stool ? Significant tenderness or worsening of abdominal pains ? Swelling of the abdomen that is new, acute ? Fever of 100?F or higher ? ? ?For urgent or emergent issues, a gastroenterologist can be reached at any hour by calling [phone]. ?Do not use MyChart messaging for urgent concerns.  ? ? ?DIET:  We do recommend a small meal at first, but then you may proceed to your regular  diet.  Drink plenty of fluids but you should avoid alcoholic beverages for 24 hours. ? ?ACTIVITY:  You should plan to take it easy for the rest of today and you should NOT DRIVE or use heavy machinery until tomorrow (because of the sedation medicines used during the test).   ? ?FOLLOW UP: ?Our staff will call the number listed on your records 48-72 hours following your procedure to check on you and address any questions or concerns that you may have regarding the information given to you following your procedure. If we do not reach you, we will leave a message.  We will attempt to reach you two times.  During this call, we will ask if you have developed any symptoms of COVID 19. If you develop any symptoms (ie: fever, flu-like symptoms, shortness of breath, cough etc.) before then, please call [phone].  If you test positive for Covid 19 in the 2 weeks post procedure, please call and report this information to [place].   ? ?If any biopsies were taken you will be contacted by phone or by letter within the next 1-3 weeks.  Please call us at [phone] if you have not heard about the biopsies in 3 weeks.  ? ? ?SIGNATURES/CONFIDENTIALITY: ?You and/or your care partner have signed paperwork which will be entered into your electronic medical record.  These signatures attest to the fact that that the information above on your After Visit Summary has been reviewed and is understood.  Full responsibility  of the confidentiality of this discharge information lies with you and/or your care-partner.  ?

## 2021-07-20 NOTE — Progress Notes (Signed)
VS  DT ? ?Pt's states no medical or surgical changes since previsit or office visit. ? ?

## 2021-07-20 NOTE — Telephone Encounter (Signed)
I spoke with Altha Harm at [facility] and she is sending a form to [phone] to be signed and sent in to the company and they will get it sent out ASAP.  ?

## 2021-07-20 NOTE — Progress Notes (Signed)
?  The recent H&P (dated 06/23/2021) was reviewed, the patient was examined and there is no change in the patients condition since that H&P was completed. ? ? ?Blake Chapman  07/20/2021, 9:00 AM ? ?

## 2021-07-20 NOTE — Progress Notes (Signed)
To Pacu, VSS. Report to Rn.tb ?

## 2021-07-20 NOTE — Op Note (Signed)
[place] ?Patient Name: Blake Chapman ?Procedure Date: 07/20/2021 9:08 AM ?MRN: [id number] ?Endoscopist: Milus Banister , MD ?Age: 36 ?Referring MD:  ?Date of Birth: [birth year] ?Gender: Male ?Account #: [id number] ?Procedure:                Colonoscopy ?Indications:              Distal UC;??presented 2017 with intermittent rectal  ?                          bleeding. Colonoscopy December 2017 Dr. Ardis Hughs  ?                          found moderate inflammation from rectum up to 25  ?                          cm. The rest of the colon was normal. Terminal  ?                          ileum could not be intubated. Biopsies from the  ?                          inflamed segment showed chronic active  ?                          inflammation.??Biopsies from non-inflamed appearing  ?                          segments of colon in the right and left segments  ?                          were all normal. He was started on mesalamine enema  ?                          and mesalamine orally.????September 2018 flare, labs  ?                          showed white blood cell count was 20k??and I Put him  ?                          on vancomycin,??C. difficile by PCR was negative  ?                          however. Eventual steroid pulse 40 mg once daily  ?                          significantly helped him clinically.??Aug 2019  ?                          flaring again,??sed rate 54,??required steroids  ?                          again.????CT scan 11/2017??confirmed inflammation  ?  distal descending and sigmoid colon. ??Stool testing  ?                          neg for infection.New start entyvio??11/2017.Labs  ?                          August 2019:??HIV negative, hepatitis B negative,  ?                          hepatitis C??negative. ??TB quant gold indeterminant  ?                          followed up by normal chest x-ray, followed up by  ?                          negative PPD.01/2019: entyvio  Antibodies negative;  ?                          entyvio trough drug leve??5.7;??increased dosing to  ?                          every 6 weeks instead of every 8 ?Medicines:                Monitored Anesthesia Care ?Procedure:                Pre-Anesthesia Assessment: ?                          - Prior to the procedure, a History and Physical  ?                          was performed, and patient medications and  ?                          allergies were reviewed. The patient's tolerance of  ?                          previous anesthesia was also reviewed. The risks  ?                          and benefits of the procedure and the sedation  ?                          options and risks were discussed with the patient.  ?                          All questions were answered, and informed consent  ?                          was obtained. Prior Anticoagulants: The patient has  ?                          taken no previous anticoagulant or antiplatelet  ?  agents. ASA Grade Assessment: II - A patient with  ?                          mild systemic disease. After reviewing the risks  ?                          and benefits, the patient was deemed in  ?                          satisfactory condition to undergo the procedure. ?                          After obtaining informed consent, the colonoscope  ?                          was passed under direct vision. Throughout the  ?                          procedure, the patient's blood pressure, pulse, and  ?                          oxygen saturations were monitored continuously. The  ?                          Olympus CF-HQ190L (#[id number]) Colonoscope was  ?                          introduced through the anus and advanced to the the  ?                          terminal ileum. The colonoscopy was performed  ?                          without difficulty. The patient tolerated the  ?                          procedure well. The quality of the bowel  ?                           preparation was good. The terminal ileum, ileocecal  ?                          valve, appendiceal orifice, and rectum were  ?                          photographed. ?Scope In: 9:11:32 AM ?Scope Out: 9:33:27 AM ?Scope Withdrawal Time: 0 hours 18 minutes 54 seconds  ?Total Procedure Duration: 0 hours 21 minutes 55 seconds  ?Findings:                 The terminal ileum appeared normal. ?                          There was inflammation throughout the colon. This  ?  was severe (with ulceration, apparent pseudopolyps,  ?                          friability) in the rectosigmoid segments with a  ?                          transition around 30cm to mild inflammation. The  ?                          rest of the colon was mildly inflamed appearing. I  ?                          sampled the colon in several separate jars. ?                          1. cecum (mild inflammation) ?                          2. ascending segment (mild inflammation) ?                          3. transverse segment (mild inflammation) ?                          4. descending segment (mild inflammation) ?                          5. rectosigmoid segment (severe inflammation) ?                          6. samples from multiple 5-44m friable polyps,  ?                          apparent pseudopolyps. ?                          7. resctosigmoid segment, sent for viral culture. ?                          The exam was otherwise without abnormality on  ?                          direct views. ?Complications:            No immediate complications. Estimated blood loss:  ?                          None. ?Estimated Blood Loss:     Estimated blood loss: none. ?Impression:               The terminal ileum appeared normal. ?                          There was inflammation throughout the colon. This  ?                          was severe (with ulceration, apparent pseudopolyps,  ?  friability) in  the rectosigmoid segments with a  ?                          transition around 30cm to mild inflammation. The  ?                          rest of the colon was mildly inflamed appearing. I  ?                          sampled the colon in several separate jars. ?                          1. cecum (mild inflammation) ?                          2. ascending segment (mild inflammation) ?                          3. transverse segment (mild inflammation) ?                          4. descending segment (mild inflammation) ?                          5. rectosigmoid segment (severe inflammation) ?                          6. samples from multiple 5-67m friable polyps,  ?                          apparent pseudopolyps. ?                          7. resctosigmoid segment, sent for viral culture. ?                          This was overall quite a bit worse than 2017  ?                          colonoscopy. ?Recommendation:           - Patient has a contact number available for  ?                          emergencies. The signs and symptoms of potential  ?                          delayed complications were discussed with the  ?                          patient. Return to normal activities tomorrow.  ?                          Written discharge instructions were provided to the  ?  patient. ?                          - Resume previous diet. ?                          - Continue present medications. ?                          - Await pathology results. ?                          - Dr. Ardis Hughs' office to start humira at standard  ?                          loading, maintainence regimen (start this week or  ?                          next hopefully) ?                          - OV with Dr. Ardis Hughs in 6 weeks. ?Milus Banister, MD ?07/20/2021 9:43:43 AM ?This report has been signed electronically. ?

## 2021-07-21 NOTE — Telephone Encounter (Signed)
The form was received and sent back by fax. ?

## 2021-07-22 ENCOUNTER — Telehealth: Payer: Self-pay | Admitting: *Deleted

## 2021-07-22 NOTE — Telephone Encounter (Signed)
?  Follow up Call- ? ? ?  07/20/2021  ?  8:48 AM  ?Call back number  ?Post procedure Call Back phone  # [phone]  ?Permission to leave phone message Yes  ?  ? ?Patient questions: ? ?Do you have a fever, pain , or abdominal swelling? No. ?Pain Score  0 * ? ?Have you tolerated food without any problems? Yes.   ? ?Have you been able to return to your normal activities? Yes.   ? ?Do you have any questions about your discharge instructions: ?Diet   No. ?Medications  No. ?Follow up visit  No. ? ?Do you have questions or concerns about your Care? Yes.   ? ?Actions: ?* If pain score is 4 or above: ?No action needed, pain <4. ? ? ?

## 2021-07-28 LAB — VIRUS CULTURE

## 2021-08-03 ENCOUNTER — Telehealth: Payer: Self-pay

## 2021-08-03 ENCOUNTER — Other Ambulatory Visit: Payer: Self-pay

## 2021-08-03 MED ORDER — HUMIRA-CD/UC/HS STARTER 80 MG/0.8ML ~~LOC~~ AJKT: 160.0000 mg | AUTO-INJECTOR | SUBCUTANEOUS | 0 refills | Status: DC

## 2021-08-03 MED ORDER — HUMIRA (2 PEN) 40 MG/0.4ML ~~LOC~~ AJKT: 1.0000 "pen " | AUTO-INJECTOR | SUBCUTANEOUS | 6 refills | Status: DC

## 2021-08-03 NOTE — Telephone Encounter (Signed)
Patient is returning your call.  

## 2021-08-03 NOTE — Telephone Encounter (Signed)
Per pt insurance the Blake Chapman has been approved from 07/28/21-01/27/22 ?I have also called [name] at [phone] to get shipping confirmation. The pt should call [phone] order # [id number] to authorize shipment. The pt has been called and given the information. He will call today and should receive his Humira next day per [facility].  ?

## 2021-08-03 NOTE — Telephone Encounter (Signed)
Per pt insurance/pharmacy see message below.  This information was passed on to [facility] on 08-22-2021.   ? ? ? ?Good morning, I received a call from the pt pharmacy they are asking to pass the fax and phone number to you for any correspondence. [phone] FAX [phone] PHONE ?

## 2021-08-03 NOTE — Telephone Encounter (Signed)
Spoke with Barbera Setters with [facility] and was told that she will call [place] and see why I was told that the PA is good from 4/5-10/5 and the pt only needs to call to authorize shipment but when the pt calls he is told we still need a PA.  The pt is aware that we are looking into this and will be back in touch with him tomorrow morning.  ?

## 2021-08-04 NOTE — Telephone Encounter (Signed)
I have emailed and spoken with [facility] and [facility] multiple times.  [facility] says the PA is approved and the pt should be able to call [facility].  The pt was called this morning and advised he should be able to approve shipment today. He will let me know if there are any further concerns. ?

## 2021-08-04 NOTE — Telephone Encounter (Signed)
Inbound call from [facility] stating that patient is now in need of a " Cost PA". Please advise.  ?

## 2021-08-04 NOTE — Telephone Encounter (Signed)
I spoke with Beth at [facility] and was told that the Humira has a paid claim and the pt can call for shipment.  I have called and left the message for the pt.   ?

## 2021-08-04 NOTE — Telephone Encounter (Signed)
Blake Chapman [phone] phone) fax [phone]) received on 07/28/21 with PA approval # [id number] JW dated from 07/28/21-01/27/22.  ?Blake Chapman Terrio ID# [id number] for Humira approved for 18 units. ?

## 2021-08-04 NOTE — Telephone Encounter (Signed)
?[place] <[email or web address]> ?Koren Shiver ? ? ?We got a paid claim with [facility]!! Sheri tried to call the patient but did not get an answer. The patient will have to call in to [facility] and will likely need a copay card. ? ?From: Koren Shiver <[email or web address]> ?Sent: Tuesday, August 03, 2021 4:58 PM ?To: [place] <[email or web address]> ?Subject: Re: HUMIRA ? ? I gave the prescription to the pharmacist this morning.  What else do I need to do?  Also, I spoke with Barbera Setters at [facility] and she is looking into this for me as well so she may need this information.   ? ?From: Dominica Westfall <[email or web address]> ?Sent: Tuesday, August 03, 2021 4:49 PM ?To: Koren Shiver <[email or web address]>; [place] <[email or web address]>; [place] <[email or web address]> ?Subject: RE: HUMIRA ? ? Hey Zurie Platas! ? ?I called the plan just now- they ran a test claim that went through. The representative said she called [facility] and it looks like they will need to get a cost exceeds maximum override before dispensing because of the price of the drug. But the PA approval dates you have are correct. We will forward a copy of the prescription to [facility] but they usually do not accept our transfers as prescriptions. ? ?Let me know if you continue to have issues with them and I can call back. ? ?Thanks, ? ?Shary Decamp ? ?  ? ?From: Koren Shiver <[email or web address]> ?Sent: Tuesday, August 03, 2021 4:26 PM ?To: Lonell Face <[email or web address]>; Shary Decamp <[email or web address]>; [place] <[email or web address]> ?Subject: HUMIRA ? ?Please see the information below regarding this pt.  We are still waiting for his Humira.  The PA was completed and approved however, they are telling him again today that a PA is needed when he calls to authorize shipment.  ? ?  ? ?  ? ?08/03/2021 4:10:13  PM ? ?[email or web address] ? ? This pt is still waiting for his medication, I was told if approved from 4-5 thru 10-5 this year. He is trying to authorize the shipment but is being told it needs a PA. We are at a loss as to what to do. We have the auth but they are still saying one is needed. He was supposed to start Humira 2 weeks ago. Can you please offer some assistance. ? ? ? ?  ? ?07/28/2021 12:08:46 PM ? ?[email or web address] ? ? Good morning, I received a call from the pt pharmacy they are asking to pass the fax and phone number to you for any correspondence. [phone] FAX [phone] PHONE ? ? ?07/27/2021 1:49:42 PM ? ?[email or web address] ? ? I received a phone call from the rep and they have asked that you fax to [phone]. ? ? As long as [facility] has the prescription everything should be good to go. The pharmacy just has to call the help desk to get the override that says the patient is allowed to fill meds over a certain limit because of the approved PA (I think it is like $10,000) and they should get a paid claim. Unfortunately, the dispensing pharmacy has to do this step. I will get in touch with [facility]. Thanks!! ? ?From: Koren Shiver <[email or web address]> ?Sent: Tuesday, August 03, 2021 4:58 PM ?To: [place] <[email or web address]> ?Subject: Re: HUMIRA ? ?I gave the prescription to the pharmacist this morning.  What else do I need to do?  Also, I spoke with Barbera Setters at [facility] and she is looking into this for me as well so she may need this information.   ? ?From: Dominica Westfall <  [email or web address]> ?Sent: Tuesday, August 03, 2021 4:49 PM ?To: Koren Shiver <[email or web address]>; [place] <[email or web address]>; [place] <[email or web address]> ?Subject: RE: HUMIRA ? ? Hey Deniese Oberry! ? ?I called the plan just now- they ran a test claim that went through. The representative said she called [facility] and it looks like they will  need to get a cost exceeds maximum override before dispensing because of the price of the drug. But the PA approval dates you have are correct. We will forward a copy of the prescription to [facility] but they usually do not accept our transfers as prescriptions. ? ?Let me know if you continue to have issues with them and I can call back. ? ?Thanks, ? ?Shary Decamp ? ?  ? ?From: Koren Shiver <[email or web address]> ?Sent: Tuesday, August 03, 2021 4:26 PM ?To: Lonell Face <[email or web address]>; Shary Decamp <[email or web address]>; [place] <[email or web address]> ?Subject: HUMIRA ? ?Please see the information below regarding this pt.  We are still waiting for his Humira.  The PA was completed and approved however, they are telling him again today that a PA is needed when he calls to authorize shipment.  ? ? 08/03/2021 4:10:13 PM ? ?Arkie [email or web address] ? ?This pt is still waiting for his medication, I was told if approved from 4-5 thru 10-5 this year. He is trying to authorize the shipment but is being told it needs a PA. We are at a loss as to what to do. We have the auth but they are still saying one is needed. He was supposed to start Humira 2 weeks ago. Can you please offer some assistance. ? ? ?07/28/2021 12:08:46 PM ? ?Berley [email or web address] ? ?  ? ? ?Good morning, I received a call from the pt pharmacy they are asking to pass the fax and phone number to you for any correspondence. [phone] FAX [phone] PHONE ? ? ?07/27/2021 1:49:42 PM ? ?[email or web address] ? ? I received a phone call from the rep and they have asked that you fax to [phone]. ? ?Koren Shiver ?[place] <[email or web address]> ?I gave the prescription to the pharmacist this morning.  What else do I need to do?  Also, I spoke with Barbera Setters at [facility] and she is looking into this for me as well so she may need this information.   ?[place]  <[email or web address]> ?Koren Shiver;[place] <[email or web address]>;Racquel [place] <[email or web address]> ?*Caution - External email - see footer for warnings* ? ?Hey Dayshia Ballinas! ? ?I called the plan just now- they ran a test claim that went through. The representative said she called [facility] and it looks like they will need to get a cost exceeds maximum override before dispensing because of the price of the drug. But the PA approval dates you have are correct. We will forward a copy of the prescription to [facility] but they usually do not accept our transfers as prescriptions. ? ?Let me know if you continue to have issues with them and I can call back. ? ?Thanks, ? ?Shary Decamp ? ?From: Koren Shiver <[email or web address]> ?Sent: Tuesday, August 03, 2021 4:26 PM ?To: Lonell Face <[email or web address]>; Shary Decamp <[email or web address]>; [place] <[email or web address]> ?Subject: HUMIRA ? ?Koren Shiver ?[email or web address] ?Simona Huh    ? ?Please see the information below regarding this pt.  We are still waiting for his Humira.  The PA was completed and approved however, they are telling him again today that a PA is needed when he calls to authorize shipment.  ?

## 2021-08-05 NOTE — Telephone Encounter (Signed)
I spoke with the pt this morning and he was told that the Humira was in route and he should receive it tomorrow.  The cost was $1000 but he used a copay card and the cost is now $5.  I have asked him to call me if he has any further problems.  The pt has been advised of the information and verbalized understanding.    ?

## 2021-08-10 ENCOUNTER — Other Ambulatory Visit: Payer: Self-pay | Admitting: Internal Medicine

## 2021-08-10 DIAGNOSIS — F411 Generalized anxiety disorder: Secondary | ICD-10-CM

## 2021-08-13 ENCOUNTER — Encounter: Payer: 59 | Admitting: Gastroenterology

## 2021-08-14 ENCOUNTER — Other Ambulatory Visit: Payer: Self-pay | Admitting: Gastroenterology

## 2021-09-27 ENCOUNTER — Other Ambulatory Visit: Payer: Self-pay | Admitting: Gastroenterology

## 2021-09-29 ENCOUNTER — Encounter: Payer: Self-pay | Admitting: Internal Medicine

## 2021-09-29 ENCOUNTER — Ambulatory Visit (INDEPENDENT_AMBULATORY_CARE_PROVIDER_SITE_OTHER): Payer: 59 | Admitting: Internal Medicine

## 2021-09-29 VITALS — BP 126/84 | HR 71 | Temp 98.0°F | Ht 69.0 in | Wt 175.0 lb

## 2021-09-29 DIAGNOSIS — H53412 Scotoma involving central area, left eye: Secondary | ICD-10-CM | POA: Diagnosis not present

## 2021-09-29 DIAGNOSIS — G4452 New daily persistent headache (NDPH): Secondary | ICD-10-CM | POA: Diagnosis not present

## 2021-09-29 DIAGNOSIS — R42 Dizziness and giddiness: Secondary | ICD-10-CM

## 2021-09-29 NOTE — Progress Notes (Signed)
Subjective:    Patient ID: Blake Chapman, male    DOB: 01-28-1986, 36 y.o.   MRN: [id number]      HPI Blake Chapman is here for  Chief Complaint  Patient presents with   Migraine    Patient attributes eye vision to possible migraine     Thursday woke up with blurry vision in left eye - bright lights or water spots on a lens type of vision - spots not in focus. He states it is the central vision only.  Over the weekend he had low grade headaches and intermittent mild nausea, dizziness that have been transient.  Had some occular migraines in past - not officially diagnosed. He did usually have headaches with these migraines. Mom has migraines.  It has been 6 days.  Over these past 6 days the vision has remained the same. It is worse first thing in the morning.  The headaches are daily but hs is treating them, the nausea, dizziness are intermittent. The headaches are in the lateral aspects of his head.     in the past his vision has involved one or both eyes.      Humira recent addition, prednisone 40 mg since January.    He has been taking Tylenol 1000 mg Q 8, aleve 2 pills Q 8 hrs x 6 days.  ? Helps.     2015 brain imaging - MRI done - showed some atrophy     Medications and allergies reviewed with patient and updated if appropriate.  Current Outpatient Medications on File Prior to Visit  Medication Sig Dispense Refill   Adalimumab (HUMIRA PEN) 40 MG/0.4ML PNKT Inject 1 pen into the skin every 14 (fourteen) days. 2 each 6   Adalimumab (HUMIRA PEN) 40 MG/0.4ML PNKT Inject 1 pen. into the skin every 14 (fourteen) days. 2 each 6   Adalimumab (HUMIRA PEN-CD/UC/HS STARTER) 80 MG/0.8ML PNKT Inject 160 mg into the skin as directed. 3 each 0   Ascorbic Acid (VITAMIN C) 100 MG tablet Take 100 mg by mouth daily.     Avanafil (STENDRA) 200 MG TABS TAKE 1 TABLET BY MOUTH ONCE DAILY 15 TO 30 MINUTES BEFORE INTERCOURSE AS DIRECTED 6 tablet 5   DULoxetine (CYMBALTA) 60 MG capsule TAKE 1 CAPSULE  BY MOUTH EVERY DAY 90 capsule 1   fluticasone (FLONASE) 50 MCG/ACT nasal spray Place into both nostrils daily.     Loperamide HCl (IMODIUM PO) Take by mouth 2 (two) times daily.     Loratadine (CLARITIN PO) Take by mouth.     Multiple Vitamin (MULTIVITAMIN) capsule Take 1 capsule by mouth daily.     omeprazole (PRILOSEC) 10 MG capsule Take 10 mg by mouth daily.     predniSONE (DELTASONE) 20 MG tablet TAKE 1 TABLET BY MOUTH EVERY DAY WITH BREAKFAST (Patient taking differently: Patient is now taking 40 mg) 30 tablet 0   propranolol (INDERAL) 10 MG tablet Take 1 tablet (10 mg total) by mouth daily as needed. 90 tablet 1   sodium chloride 0.9 % SOLN 250 mL with vedolizumab 300 MG SOLR 300 mg Inject 300 mg into the vein every 6 (six) weeks.     traZODone (DESYREL) 50 MG tablet TAKE 1 TABLET BY MOUTH EVERY DAY AT BEDTIME AS NEEDED 90 tablet 1   VITAMIN D, CHOLECALCIFEROL, PO Take by mouth.     No current facility-administered medications on file prior to visit.    Review of Systems  Constitutional:  Negative for fever.  Eyes:  Positive for visual disturbance.  Respiratory:  Negative for shortness of breath.   Cardiovascular:  Negative for chest pain and palpitations.  Gastrointestinal:  Positive for nausea. Negative for abdominal pain.  Neurological:  Positive for dizziness and headaches. Negative for weakness, light-headedness and numbness.      Objective:   Vitals:   09/29/21 1433  BP: 126/84  Pulse: 71  Temp: 98 F (36.7 C)  SpO2: 98%   BP Readings from Last 3 Encounters:  09/29/21 126/84  07/20/21 112/76  06/23/21 122/88   Wt Readings from Last 3 Encounters:  09/29/21 175 lb (79.4 kg)  07/20/21 166 lb (75.3 kg)  06/23/21 166 lb 2 oz (75.4 kg)   Body mass index is 25.84 kg/m.    Physical Exam Constitutional:      General: He is not in acute distress.    Appearance: Normal appearance.  HENT:     Head: Normocephalic and atraumatic.     Right Ear: Tympanic membrane,  ear canal and external ear normal.     Left Ear: Tympanic membrane, ear canal and external ear normal.  Eyes:     Extraocular Movements: Extraocular movements intact.     Conjunctiva/sclera: Conjunctivae normal.     Pupils: Pupils are equal, round, and reactive to light.  Cardiovascular:     Rate and Rhythm: Normal rate and regular rhythm.     Heart sounds: No murmur heard. Pulmonary:     Effort: Pulmonary effort is normal. No respiratory distress.     Breath sounds: Normal breath sounds. No wheezing or rales.  Musculoskeletal:     Cervical back: Neck supple. No tenderness.  Lymphadenopathy:     Cervical: No cervical adenopathy.  Skin:    General: Skin is warm and dry.  Neurological:     General: No focal deficit present.     Mental Status: He is alert and oriented to person, place, and time.     Cranial Nerves: No cranial nerve deficit.     Sensory: No sensory deficit.     Motor: No weakness.  Psychiatric:        Mood and Affect: Mood normal.           Assessment & Plan:    Central loss of vision of left eye, headaches, nausea, dizziness: Acute Woke 6 days ago with loss of central vision in the left that has been persistent since - worse in am, better has day progresses Low grade b/l headaches in lateral aspects - taking tylenol and aleve Intermittent nausea and dizziness He has a h/o migraines but has never been officially diagnosed - this could be a migraine, but the persistent vision change vision loss does not seem typical Need to see ophthalmology asap  - has appt tomorrow MRI of brain ordered stat Try to decrease tylenol/aleve to r/o of the possibility of rebound headaches Hold off on medication until above evaluation

## 2021-09-29 NOTE — Patient Instructions (Addendum)
    An MRI of your brain was ordered.      Medications changes include :   try to decrease your tylenol and aleve intake.     A referral was ordered for ophthalmology.     Someone from that office will call you to schedule an appointment.     Return if symptoms worsen or fail to improve.

## 2021-09-30 ENCOUNTER — Telehealth: Payer: Self-pay | Admitting: Internal Medicine

## 2021-09-30 DIAGNOSIS — H5462 Unqualified visual loss, left eye, normal vision right eye: Secondary | ICD-10-CM

## 2021-09-30 NOTE — Telephone Encounter (Signed)
Kendrick Fries from [facility] called - They saw patient today and the provider there wants patient to have an MRI of the orbits with and without contrast. They want to know since Dr. Quay Burow ordered the MRI of the brain if she wants to go ahead and order the orbital MRI or do they want them to place the order. They are faxing the notes from today's visit. Please call back and speak with Kendrick Fries at [phone].

## 2021-09-30 NOTE — Telephone Encounter (Signed)
Orbit MRI ordered

## 2021-10-01 ENCOUNTER — Encounter: Payer: Self-pay | Admitting: Gastroenterology

## 2021-10-01 ENCOUNTER — Ambulatory Visit: Payer: 59 | Admitting: Family Medicine

## 2021-10-01 NOTE — Telephone Encounter (Signed)
Dr Havery Moros can you please help with this pt?  Dr Ardis Hughs is out and you are DOD.  Thank you

## 2021-10-03 ENCOUNTER — Ambulatory Visit
Admission: RE | Admit: 2021-10-03 | Discharge: 2021-10-03 | Disposition: A | Discharge status: Home / Self Care | Payer: 59 | Source: Ambulatory Visit | Attending: Internal Medicine | Admitting: Internal Medicine

## 2021-10-03 DIAGNOSIS — H5462 Unqualified visual loss, left eye, normal vision right eye: Secondary | ICD-10-CM

## 2021-10-03 DIAGNOSIS — H53412 Scotoma involving central area, left eye: Secondary | ICD-10-CM

## 2021-10-03 DIAGNOSIS — R42 Dizziness and giddiness: Secondary | ICD-10-CM

## 2021-10-03 DIAGNOSIS — G4452 New daily persistent headache (NDPH): Secondary | ICD-10-CM

## 2021-10-03 MED ORDER — GADOBENATE DIMEGLUMINE 529 MG/ML IV SOLN
16.0000 mL | Freq: Once | INTRAVENOUS | Status: AC | PRN
  Administered 2021-10-03: 16 mL via INTRAVENOUS

## 2021-11-28 ENCOUNTER — Other Ambulatory Visit: Payer: Self-pay | Admitting: Gastroenterology

## 2021-11-29 ENCOUNTER — Telehealth: Payer: Self-pay | Admitting: Gastroenterology

## 2021-11-29 NOTE — Telephone Encounter (Signed)
PT is returning call about Prednisone refill. Please reach out to advise. Thank you.

## 2021-11-29 NOTE — Telephone Encounter (Signed)
PT is returning call about Prednisone. Please reach out to advise. Thank you.

## 2021-11-29 NOTE — Telephone Encounter (Signed)
Left message for patient to return call to verify prednisone dose or asked that he send a MyChart message.

## 2021-11-30 NOTE — Telephone Encounter (Signed)
Rx sent to pharmacy   

## 2022-01-25 ENCOUNTER — Other Ambulatory Visit: Payer: Self-pay | Admitting: Gastroenterology

## 2022-01-25 NOTE — Telephone Encounter (Signed)
Dr Loletha Carrow is it ok to refill this under your name for Dr Ardis Hughs pt?

## 2022-01-27 ENCOUNTER — Other Ambulatory Visit: Payer: Self-pay

## 2022-01-27 NOTE — Telephone Encounter (Signed)
Yes, I will refill the Humira at current dose since I am DOD today.  However, chart review reveals that this patient with ulcerative proctosigmoiditis was started on Humira after his last office appointment with Dr. Ardis Hughs in April and has not been seen since then.  In addition, it sounds like he might still be on prednisone since that prescription was renewed by a CMA in August.  This patient unquestionably needs the next available clinic appointment with any physician to assume care of his colitis in Dr. Audelia Acton absence.  - H. Loletha Carrow, MD

## 2022-02-17 ENCOUNTER — Other Ambulatory Visit (HOSPITAL_COMMUNITY): Payer: Self-pay

## 2022-02-23 ENCOUNTER — Telehealth: Payer: Self-pay | Admitting: Internal Medicine

## 2022-02-23 NOTE — Telephone Encounter (Signed)
Caller Name: Haleem Hanner Call back phone #: [phone]  MEDICATION(S):  anafil (STENDRA) 200 MG TABS  Days of Med Remaining:   Has the patient contacted their pharmacy (YES/NO)? yes What did pharmacy advise? Prescription expired as of 02/22/22  Preferred Pharmacy:  [facility] [place]  [place], [place] [address]  Phone:  [phone]  Fax:  [phone]   ~~~Please advise patient/caregiver to allow 2-3 business days to process RX refills.

## 2022-02-23 NOTE — Telephone Encounter (Signed)
Lvm for patient to call back and schedule appointment

## 2022-02-28 ENCOUNTER — Ambulatory Visit (INDEPENDENT_AMBULATORY_CARE_PROVIDER_SITE_OTHER): Payer: 59 | Admitting: Emergency Medicine

## 2022-02-28 ENCOUNTER — Telehealth: Payer: Self-pay | Admitting: Internal Medicine

## 2022-02-28 ENCOUNTER — Encounter: Payer: Self-pay | Admitting: Emergency Medicine

## 2022-02-28 DIAGNOSIS — N522 Drug-induced erectile dysfunction: Secondary | ICD-10-CM | POA: Diagnosis not present

## 2022-02-28 MED ORDER — STENDRA 200 MG PO TABS: ORAL_TABLET | ORAL | 10 refills | Status: DC

## 2022-02-28 NOTE — Patient Instructions (Signed)
Health Maintenance, Male Adopting a healthy lifestyle and getting preventive care are important in promoting health and wellness. Ask your health care provider about: The right schedule for you to have regular tests and exams. Things you can do on your own to prevent diseases and keep yourself healthy. What should I know about diet, weight, and exercise? Eat a healthy diet  Eat a diet that includes plenty of vegetables, fruits, low-fat dairy products, and lean protein. Do not eat a lot of foods that are high in solid fats, added sugars, or sodium. Maintain a healthy weight Body mass index (BMI) is a measurement that can be used to identify possible weight problems. It estimates body fat based on height and weight. Your health care provider can help determine your BMI and help you achieve or maintain a healthy weight. Get regular exercise Get regular exercise. This is one of the most important things you can do for your health. Most adults should: Exercise for at least 150 minutes each week. The exercise should increase your heart rate and make you sweat (moderate-intensity exercise). Do strengthening exercises at least twice a week. This is in addition to the moderate-intensity exercise. Spend less time sitting. Even light physical activity can be beneficial. Watch cholesterol and blood lipids Have your blood tested for lipids and cholesterol at 38 years of age, then have this test every 5 years. You may need to have your cholesterol levels checked more often if: Your lipid or cholesterol levels are high. You are older than 38 years of age. You are at high risk for heart disease. What should I know about cancer screening? Many types of cancers can be detected early and may often be prevented. Depending on your health history and family history, you may need to have cancer screening at various ages. This may include screening for: Colorectal cancer. Prostate cancer. Skin cancer. Lung  cancer. What should I know about heart disease, diabetes, and high blood pressure? Blood pressure and heart disease High blood pressure causes heart disease and increases the risk of stroke. This is more likely to develop in people who have high blood pressure readings or are overweight. Talk with your health care provider about your target blood pressure readings. Have your blood pressure checked: Every 3-5 years if you are 18-39 years of age. Every year if you are 40 years old or older. If you are between the ages of 65 and 75 and are a current or former smoker, ask your health care provider if you should have a one-time screening for abdominal aortic aneurysm (AAA). Diabetes Have regular diabetes screenings. This checks your fasting blood sugar level. Have the screening done: Once every three years after age 45 if you are at a normal weight and have a low risk for diabetes. More often and at a younger age if you are overweight or have a high risk for diabetes. What should I know about preventing infection? Hepatitis B If you have a higher risk for hepatitis B, you should be screened for this virus. Talk with your health care provider to find out if you are at risk for hepatitis B infection. Hepatitis C Blood testing is recommended for: Everyone born from 1945 through 1965. Anyone with known risk factors for hepatitis C. Sexually transmitted infections (STIs) You should be screened each year for STIs, including gonorrhea and chlamydia, if: You are sexually active and are younger than 38 years of age. You are older than 38 years of age and your   health care provider tells you that you are at risk for this type of infection. Your sexual activity has changed since you were last screened, and you are at increased risk for chlamydia or gonorrhea. Ask your health care provider if you are at risk. Ask your health care provider about whether you are at high risk for HIV. Your health care provider  may recommend a prescription medicine to help prevent HIV infection. If you choose to take medicine to prevent HIV, you should first get tested for HIV. You should then be tested every 3 months for as long as you are taking the medicine. Follow these instructions at home: Alcohol use Do not drink alcohol if your health care provider tells you not to drink. If you drink alcohol: Limit how much you have to 0-2 drinks a day. Know how much alcohol is in your drink. In the [place] one drink equals one 12 oz bottle of beer (355 mL), one 5 oz glass of wine (148 mL), or one 1 oz glass of hard liquor (44 mL). Lifestyle Do not use any products that contain nicotine or tobacco. These products include cigarettes, chewing tobacco, and vaping devices, such as e-cigarettes. If you need help quitting, ask your health care provider. Do not use street drugs. Do not share needles. Ask your health care provider for help if you need support or information about quitting drugs. General instructions Schedule regular health, dental, and eye exams. Stay current with your vaccines. Tell your health care provider if: You often feel depressed. You have ever been abused or do not feel safe at home. Summary Adopting a healthy lifestyle and getting preventive care are important in promoting health and wellness. Follow your health care provider's instructions about healthy diet, exercising, and getting tested or screened for diseases. Follow your health care provider's instructions on monitoring your cholesterol and blood pressure. This information is not intended to replace advice given to you by your health care provider. Make sure you discuss any questions you have with your health care provider. Document Revised: 08/31/2020 Document Reviewed: 08/31/2020 Elsevier Patient Education  2023 [employer]  

## 2022-02-28 NOTE — Telephone Encounter (Signed)
PT visits today post visit with Dr.Sagardia. PT was wanting to get the location of their RX Stendra (avanafil) changed to [place], [place].

## 2022-02-28 NOTE — Progress Notes (Signed)
Blake Chapman 36 y.o.   Chief Complaint  Patient presents with   medicaiton management    Patient needs a refill of medication     HISTORY OF PRESENT ILLNESS: This is a 36 y.o. male here for medication refill. Patient of Dr. Scarlette Calico.  Scheduled for physical next January 2024 No other complaints or medical concerns today.  HPI   Prior to Admission medications   Medication Sig Start Date End Date Taking? Authorizing Provider  Adalimumab (HUMIRA PEN) 40 MG/0.4ML PNKT Inject 1 pen into the skin every 14 (fourteen) days. 06/23/21  Yes Milus Banister, MD  Adalimumab (HUMIRA PEN) 40 MG/0.4ML PNKT Inject 1 pen. into the skin every 14 (fourteen) days. 08/03/21  Yes Milus Banister, MD  Adalimumab [facility] PEN-CD/UC/HS STARTER) 80 MG/0.8ML PNKT Inject 160 mg into the skin as directed. 08/03/21  Yes Milus Banister, MD  Ascorbic Acid (VITAMIN C) 100 MG tablet Take 100 mg by mouth daily.   Yes [provider]  Avanafil (STENDRA) 200 MG TABS TAKE 1 TABLET BY MOUTH ONCE DAILY 15 TO 30 MINUTES BEFORE INTERCOURSE AS DIRECTED 02/22/21  Yes Burns, Claudina Lick, MD  DULoxetine (CYMBALTA) 60 MG capsule TAKE 1 CAPSULE BY MOUTH EVERY DAY 08/10/21  Yes Janith Lima, MD  fluticasone (FLONASE) 50 MCG/ACT nasal spray Place into both nostrils daily.   Yes [provider]  HUMIRA PEN 40 MG/0.4ML PNKT INJECT 40MG SUBCUTANEOUSLY EVERY OTHER WEEK 01/27/22  Yes Danis, Estill Cotta III, MD  Loperamide HCl (IMODIUM PO) Take by mouth 2 (two) times daily.   Yes [provider]  Loratadine (CLARITIN PO) Take by mouth.   Yes [provider]  Multiple Vitamin (MULTIVITAMIN) capsule Take 1 capsule by mouth daily.   Yes [provider]  omeprazole (PRILOSEC) 10 MG capsule Take 10 mg by mouth daily.   Yes [provider]  predniSONE (DELTASONE) 20 MG tablet Take 2 tablets (40 mg total) by mouth daily. Patient is now taking 40 m 11/30/21  Yes Milus Banister, MD  propranolol  (INDERAL) 10 MG tablet Take 1 tablet (10 mg total) by mouth daily as needed. 05/14/20  Yes Janith Lima, MD  traZODone (DESYREL) 50 MG tablet TAKE 1 TABLET BY MOUTH EVERY DAY AT BEDTIME AS NEEDED 08/10/21  Yes Janith Lima, MD  VITAMIN D, CHOLECALCIFEROL, PO Take by mouth.   Yes [provider]    No Known Allergies  Patient Active Problem List   Diagnosis Date Noted   Deficiency anemia 11/29/2017   Drug-induced erectile dysfunction 11/29/2017   Leukocytosis 11/18/2016   UC (ulcerative colitis) ([place]) 11/17/2016   GAD (generalized anxiety disorder) 06/12/2014   Alcohol abuse, in remission 06/12/2014   Routine general medical examination at a health care facility 06/12/2014    Past Medical History:  Diagnosis Date   Allergy    Anxiety    Depression    GERD (gastroesophageal reflux disease)    Neuromuscular disorder (HCC)    acute bil lower legs neuropathy   Substance abuse ([place])    alcohol - previous    Past Surgical History:  Procedure Laterality Date   clavical - right repair     2010   HARDWARE REMOVAL  2011   HERNIA REPAIR  1991    Social History   Socioeconomic History   Marital status: Single    Spouse name: Not on file   Number of children: 0   Years of education: Not on file  Highest education level: Not on file  Occupational History   Occupation: Attorney  Tobacco Use   Smoking status: Light Smoker    Types: Cigars   Smokeless tobacco: Never   Tobacco comments:    1 per day  Vaping Use   Vaping Use: Never used  Substance and Sexual Activity   Alcohol use: No    Alcohol/week: 0.0 standard drinks of alcohol    Comment: Alcoholism- remission - Oct 2015   Drug use: No   Sexual activity: Yes    Partners: Female    Birth control/protection: Condom  Other Topics Concern   Not on file  Social History Narrative   Not on file   Social Determinants of Health   Financial Resource Strain: Not on file  Food Insecurity: Not on file   Transportation Needs: Not on file  Physical Activity: Not on file  Stress: Not on file  Social Connections: Not on file  Intimate Partner Violence: Not on file    Family History  Problem Relation Age of Onset   Thyroid cancer Mother    Breast cancer Maternal Grandmother    Colon cancer Maternal Grandmother 97       died 2 weeks later   Prostate cancer Maternal Uncle    Esophageal cancer Maternal Grandfather        died mid 45's   Cancer Neg Hx    Alcohol abuse Neg Hx    Depression Neg Hx    Drug abuse Neg Hx    Early death Neg Hx    Stroke Neg Hx    Kidney disease Neg Hx    Hypertension Neg Hx    Hyperlipidemia Neg Hx    Pancreatic cancer Neg Hx    Rectal cancer Neg Hx    Stomach cancer Neg Hx      Review of Systems  Constitutional: Negative.  Negative for chills and fever.  HENT:  Negative for congestion and sore throat.   Respiratory:  Negative for cough and shortness of breath.   Cardiovascular:  Negative for chest pain.  Gastrointestinal:  Negative for abdominal pain, diarrhea, nausea and vomiting.  Skin:  Negative for rash.  Neurological:  Negative for dizziness and headaches.  All other systems reviewed and are negative.   Today's Vitals   02/28/22 1502  BP: 132/80  Pulse: 67  Temp: 98.3 F (36.8 C)  TempSrc: Oral  SpO2: 97%  Weight: 173 lb 2 oz (78.5 kg)  Height: 5' 9"  (1.753 m)   Body mass index is 25.57 kg/m.  Physical Exam Vitals reviewed.  Constitutional:      Appearance: Normal appearance.  HENT:     Head: Normocephalic.  Eyes:     Extraocular Movements: Extraocular movements intact.  Cardiovascular:     Rate and Rhythm: Normal rate.  Pulmonary:     Effort: Pulmonary effort is normal.  Skin:    General: Skin is warm and dry.  Neurological:     Mental Status: He is alert and oriented to person, place, and time.  Psychiatric:        Mood and Affect: Mood normal.        Behavior: Behavior normal.      ASSESSMENT &  PLAN: Problem List Items Addressed This Visit       Other   Drug-induced erectile dysfunction   Relevant Medications   Avanafil (STENDRA) 200 MG TABS   Patient Instructions  Health Maintenance, Male Adopting a healthy lifestyle and getting preventive care  are important in promoting health and wellness. Ask your health care provider about: The right schedule for you to have regular tests and exams. Things you can do on your own to prevent diseases and keep yourself healthy. What should I know about diet, weight, and exercise? Eat a healthy diet  Eat a diet that includes plenty of vegetables, fruits, low-fat dairy products, and lean protein. Do not eat a lot of foods that are high in solid fats, added sugars, or sodium. Maintain a healthy weight Body mass index (BMI) is a measurement that can be used to identify possible weight problems. It estimates body fat based on height and weight. Your health care provider can help determine your BMI and help you achieve or maintain a healthy weight. Get regular exercise Get regular exercise. This is one of the most important things you can do for your health. Most adults should: Exercise for at least 150 minutes each week. The exercise should increase your heart rate and make you sweat (moderate-intensity exercise). Do strengthening exercises at least twice a week. This is in addition to the moderate-intensity exercise. Spend less time sitting. Even light physical activity can be beneficial. Watch cholesterol and blood lipids Have your blood tested for lipids and cholesterol at 38 years of age, then have this test every 5 years. You may need to have your cholesterol levels checked more often if: Your lipid or cholesterol levels are high. You are older than 38 years of age. You are at high risk for heart disease. What should I know about cancer screening? Many types of cancers can be detected early and may often be prevented. Depending on your  health history and family history, you may need to have cancer screening at various ages. This may include screening for: Colorectal cancer. Prostate cancer. Skin cancer. Lung cancer. What should I know about heart disease, diabetes, and high blood pressure? Blood pressure and heart disease High blood pressure causes heart disease and increases the risk of stroke. This is more likely to develop in people who have high blood pressure readings or are overweight. Talk with your health care provider about your target blood pressure readings. Have your blood pressure checked: Every 3-5 years if you are 30-69 years of age. Every year if you are 32 years old or older. If you are between the ages of 15 and 21 and are a current or former smoker, ask your health care provider if you should have a one-time screening for abdominal aortic aneurysm (AAA). Diabetes Have regular diabetes screenings. This checks your fasting blood sugar level. Have the screening done: Once every three years after age 51 if you are at a normal weight and have a low risk for diabetes. More often and at a younger age if you are overweight or have a high risk for diabetes. What should I know about preventing infection? Hepatitis B If you have a higher risk for hepatitis B, you should be screened for this virus. Talk with your health care provider to find out if you are at risk for hepatitis B infection. Hepatitis C Blood testing is recommended for: Everyone born from [birth year] through 1967. Anyone with known risk factors for hepatitis C. Sexually transmitted infections (STIs) You should be screened each year for STIs, including gonorrhea and chlamydia, if: You are sexually active and are younger than 38 years of age. You are older than 38 years of age and your health care provider tells you that you are at risk for  this type of infection. Your sexual activity has changed since you were last screened, and you are at increased risk  for chlamydia or gonorrhea. Ask your health care provider if you are at risk. Ask your health care provider about whether you are at high risk for HIV. Your health care provider may recommend a prescription medicine to help prevent HIV infection. If you choose to take medicine to prevent HIV, you should first get tested for HIV. You should then be tested every 3 months for as long as you are taking the medicine. Follow these instructions at home: Alcohol use Do not drink alcohol if your health care provider tells you not to drink. If you drink alcohol: Limit how much you have to 0-2 drinks a day. Know how much alcohol is in your drink. In the [place] one drink equals one 12 oz bottle of beer (355 mL), one 5 oz glass of wine (148 mL), or one 1 oz glass of hard liquor (44 mL). Lifestyle Do not use any products that contain nicotine or tobacco. These products include cigarettes, chewing tobacco, and vaping devices, such as e-cigarettes. If you need help quitting, ask your health care provider. Do not use street drugs. Do not share needles. Ask your health care provider for help if you need support or information about quitting drugs. General instructions Schedule regular health, dental, and eye exams. Stay current with your vaccines. Tell your health care provider if: You often feel depressed. You have ever been abused or do not feel safe at home. Summary Adopting a healthy lifestyle and getting preventive care are important in promoting health and wellness. Follow your health care provider's instructions about healthy diet, exercising, and getting tested or screened for diseases. Follow your health care provider's instructions on monitoring your cholesterol and blood pressure. This information is not intended to replace advice given to you by your health care provider. Make sure you discuss any questions you have with your health care provider. Document Revised: 08/31/2020 Document Reviewed:  08/31/2020 Elsevier Patient Education  [place], MD [facility]

## 2022-03-01 ENCOUNTER — Other Ambulatory Visit (HOSPITAL_COMMUNITY): Payer: Self-pay

## 2022-03-02 ENCOUNTER — Other Ambulatory Visit (HOSPITAL_COMMUNITY): Payer: Self-pay

## 2022-03-02 ENCOUNTER — Telehealth: Payer: Self-pay

## 2022-03-02 NOTE — Telephone Encounter (Signed)
Patient Advocate Encounter   Received notification from [employer] that prior authorization for Humira 30m/0.4ml is required.   PA submitted on 03/02/2022       AJoneen Boers CMonteaglePatient Advocate Specialist CLeaPatient Advocate Team Direct Number: [phone]: [phone]

## 2022-03-03 ENCOUNTER — Other Ambulatory Visit (HOSPITAL_COMMUNITY): Payer: Self-pay

## 2022-03-03 NOTE — Telephone Encounter (Signed)
Pharmacy Patient Advocate Encounter  Prior Authorization for Humira 3m/.04ml has been approved.    PA# [id number] Effective dates: 03/02/2022 through 03/03/2023

## 2022-03-14 ENCOUNTER — Encounter: Payer: Self-pay | Admitting: Internal Medicine

## 2022-03-14 ENCOUNTER — Ambulatory Visit (INDEPENDENT_AMBULATORY_CARE_PROVIDER_SITE_OTHER): Payer: 59 | Admitting: Internal Medicine

## 2022-03-14 VITALS — BP 126/70 | HR 85 | Ht 69.0 in | Wt 175.0 lb

## 2022-03-14 DIAGNOSIS — Z7960 Long term (current) use of unspecified immunomodulators and immunosuppressants: Secondary | ICD-10-CM | POA: Insufficient documentation

## 2022-03-14 DIAGNOSIS — Z7952 Long term (current) use of systemic steroids: Secondary | ICD-10-CM

## 2022-03-14 DIAGNOSIS — K51019 Ulcerative (chronic) pancolitis with unspecified complications: Secondary | ICD-10-CM

## 2022-03-14 DIAGNOSIS — H348122 Central retinal vein occlusion, left eye, stable: Secondary | ICD-10-CM | POA: Diagnosis not present

## 2022-03-14 HISTORY — DX: Central retinal vein occlusion, left eye, stable: H34.8122

## 2022-03-14 NOTE — Progress Notes (Signed)
Blake Chapman 36 y.o. 23-Apr-1986 [id number]  Assessment & Plan:   Encounter Diagnoses  Name Primary?   Universal ulcerative colitis with complication (HCC) Yes   Long-term use of immunosuppressant medication    Central retinal vein occlusion of left eye, unspecified complication status    Long term (current) use of systemic steroids     He has ulcerative colitis, on Humira every other week for about 6 months and still requiring steroids.  He has failed Entyvio.  Need to determine if he is developed antibodies if he has subtherapeutic levels as options might be to increase Humira to weekly.  May need to change biologic agent.  Choice of therapy would keep in mind his central retinal vein occlusion though it might of been uncontrolled or flaring ulcerative colitis that was a precipitant for that clot.  Specifically Rinvoq might be a good choice for him though there have been some increased reports of thromboembolic episodes on that so would factor that in, understanding again that might be worth trying to get disease under control if necessary.  I suppose other options could be infliximab versus Stelara.   Orders Placed This Encounter  Procedures   CBC with Differential/Platelet   Comprehensive metabolic panel   Sedimentation rate   C-reactive protein   Adalimumab+Ab (Serial Monitor)   Vitamin D (25 hydroxy)      Subjective:    Review of pertinent gastrointestinal problems: 1. Distal UC; presented 2017 with intermittent rectal bleeding. Colonoscopy December 2017 Dr. Ardis Hughs found moderate inflammation from rectum up to 25 cm. The rest of the colon was normal. Terminal ileum could not be intubated. Biopsies from the inflamed segment showed chronic active inflammation. Biopsies from non-inflamed appearing segments of colon in the right and left segments were all normal. He was started on mesalamine enema and mesalamine orally.  September 2018 flare, labs showed white blood cell count  was 20k and I Put him on vancomycin, C. difficile by PCR was negative however. Eventual steroid pulse 40 mg once daily significantly helped him clinically. Aug 2019 flaring again, sed rate 54, required steroids again.  CT scan 11/2017 confirmed inflammation distal descending and sigmoid colon.  Stool testing neg for infection. New start entyvio 11/2017. Labs August 2019: HIV negative, hepatitis B negative, hepatitis C negative.  TB quant gold indeterminant followed up by normal chest x-ray, followed up by negative PPD. 01/2019: entyvio Antibodies negative; entyvio trough drug leve 5.7; increased dosing to every 6 weeks instead of every 8  Colonoscopy Ardis Hughs 07/20/2021-universal UC severe rectum to 30 cm mild throughout otherwise biopsies confirm and no signs of viral infection  Humira initiated late March early April 2023 40 mg every other week      Chief Complaint: Ulcerative colitis  HPI  36 year old white man with ulcerative colitis as outlined above.  He had been on 40 mg prednisone in the spring and summer and he has tried to titrate down while starting Humira but has been unable to get below 20 mg.  He is on 20 mg currently and he has urgency at times occasional blood occasional mucus production and occasional loose stool still has some formed stools also.  Uses Imodium a couple of times a day.  In June he had central retinal vein occlusion on the left and optic neuritis.  He is following with a retina specialist and receiving intraocular injections monthly.  Vision is improved but not back to normal.  He tells me his mother has now been diagnosed  with Crohn's disease.  No Known Allergies Current Meds  Medication Sig   Ascorbic Acid (VITAMIN C) 100 MG tablet Take 100 mg by mouth daily.   Avanafil (STENDRA) 200 MG TABS TAKE 1 TABLET BY MOUTH ONCE DAILY 15 TO 30 MINUTES BEFORE INTERCOURSE AS DIRECTED   DULoxetine (CYMBALTA) 60 MG capsule TAKE 1 CAPSULE BY MOUTH EVERY DAY   fluticasone  (FLONASE) 50 MCG/ACT nasal spray Place into both nostrils daily.   HUMIRA PEN 40 MG/0.4ML PNKT INJECT 40MG SUBCUTANEOUSLY EVERY OTHER WEEK   Loperamide HCl (IMODIUM PO) Take by mouth 2 (two) times daily.   Loratadine (CLARITIN PO) Take by mouth.   Multiple Vitamin (MULTIVITAMIN) capsule Take 1 capsule by mouth daily.   omeprazole (PRILOSEC) 10 MG capsule Take 10 mg by mouth daily.   predniSONE (DELTASONE) 20 MG tablet Take 2 tablets (40 mg total) by mouth daily. Patient is now taking 40 m   propranolol (INDERAL) 10 MG tablet Take 1 tablet (10 mg total) by mouth daily as needed.   traZODone (DESYREL) 50 MG tablet TAKE 1 TABLET BY MOUTH EVERY DAY AT BEDTIME AS NEEDED   VITAMIN D, CHOLECALCIFEROL, PO Take by mouth.   Past Medical History:  Diagnosis Date   Allergy    Anxiety    Central retinal vein occlusion of left eye 03/14/2022   Depression    GERD (gastroesophageal reflux disease)    Neuromuscular disorder (HCC)    acute bil lower legs neuropathy   Substance abuse ([place])    alcohol - previous   Universal ulcerative colitis ([place])    Past Surgical History:  Procedure Laterality Date   clavical - right repair     2010   HARDWARE REMOVAL  2011   [place]   Social History   Social History Narrative   Single, he is a patent and Photographer   Alcoholism in remission   Cigar smoker occasionally   No drug use   family history includes Breast cancer in his maternal grandmother; Colon cancer (age of onset: 8) in his maternal grandmother; Crohn's disease in his mother; Esophageal cancer in his maternal grandfather; Prostate cancer in his maternal uncle; Thyroid cancer in his mother.   Review of Systems See HPI  Objective:   Physical Exam @BP  126/70   Pulse 85   Ht 5' 9"  (1.753 m)   Wt 175 lb (79.4 kg)   BMI 25.84 kg/m @  General:  NAD Eyes:   anicteric Lungs:  clear Heart::  S1S2 no rubs, murmurs or gallops Abdomen:  soft and nontender, BS+ Ext:   no  edema, cyanosis or clubbing    Data Reviewed:  As per HPI and GI summary above.

## 2022-03-14 NOTE — Patient Instructions (Signed)
Your provider has requested that you go to the basement level for lab work . Press "B" on the elevator. The lab is located at the first door on the left as you exit the elevator.  Come Wednesday to have labs drawn.    Stay on your prednisone 21m per Dr GCarlean Purl   I appreciate the opportunity to care for you. CHilton Sinclair MD

## 2022-03-16 ENCOUNTER — Other Ambulatory Visit (INDEPENDENT_AMBULATORY_CARE_PROVIDER_SITE_OTHER): Payer: 59

## 2022-03-16 DIAGNOSIS — Z7952 Long term (current) use of systemic steroids: Secondary | ICD-10-CM

## 2022-03-16 DIAGNOSIS — Z7960 Long term (current) use of unspecified immunomodulators and immunosuppressants: Secondary | ICD-10-CM | POA: Diagnosis not present

## 2022-03-16 DIAGNOSIS — K51019 Ulcerative (chronic) pancolitis with unspecified complications: Secondary | ICD-10-CM

## 2022-03-16 LAB — VITAMIN D 25 HYDROXY (VIT D DEFICIENCY, FRACTURES): VITD: 96.02 ng/mL (ref 30.00–100.00)

## 2022-03-16 LAB — CBC WITH DIFFERENTIAL/PLATELET
Basophils Absolute: 0 10*3/uL (ref 0.0–0.1)
Basophils Relative: 0.3 % (ref 0.0–3.0)
Eosinophils Absolute: 0 10*3/uL (ref 0.0–0.7)
Eosinophils Relative: 0.3 % (ref 0.0–5.0)
HCT: 40.3 % (ref 39.0–52.0)
Hemoglobin: 13.5 g/dL (ref 13.0–17.0)
Lymphocytes Relative: 4 % — ABNORMAL LOW (ref 12.0–46.0)
Lymphs Abs: 0.5 10*3/uL — ABNORMAL LOW (ref 0.7–4.0)
MCHC: 33.6 g/dL (ref 30.0–36.0)
MCV: 89.2 fl (ref 78.0–100.0)
Monocytes Absolute: 0.5 10*3/uL (ref 0.1–1.0)
Monocytes Relative: 3.8 % (ref 3.0–12.0)
Neutro Abs: 10.9 10*3/uL — ABNORMAL HIGH (ref 1.4–7.7)
Neutrophils Relative %: 91.6 % — ABNORMAL HIGH (ref 43.0–77.0)
Platelets: 259 10*3/uL (ref 150.0–400.0)
RBC: 4.52 Mil/uL (ref 4.22–5.81)
RDW: 13.6 % (ref 11.5–15.5)
WBC: 11.9 10*3/uL — ABNORMAL HIGH (ref 4.0–10.5)

## 2022-03-16 LAB — C-REACTIVE PROTEIN: CRP: 1 mg/dL (ref 0.5–20.0)

## 2022-03-16 LAB — COMPREHENSIVE METABOLIC PANEL WITH GFR
ALT: 19 U/L (ref 0–53)
AST: 16 U/L (ref 0–37)
Albumin: 4.4 g/dL (ref 3.5–5.2)
Alkaline Phosphatase: 45 U/L (ref 39–117)
BUN: 14 mg/dL (ref 6–23)
CO2: 28 meq/L (ref 19–32)
Calcium: 10.4 mg/dL (ref 8.4–10.5)
Chloride: 103 meq/L (ref 96–112)
Creatinine, Ser: 0.96 mg/dL (ref 0.40–1.50)
GFR: 100.23 mL/min
Glucose, Bld: 128 mg/dL — ABNORMAL HIGH (ref 70–99)
Potassium: 3.7 meq/L (ref 3.5–5.1)
Sodium: 137 meq/L (ref 135–145)
Total Bilirubin: 0.5 mg/dL (ref 0.2–1.2)
Total Protein: 7.2 g/dL (ref 6.0–8.3)

## 2022-03-16 LAB — SEDIMENTATION RATE: Sed Rate: 21 mm/hr — ABNORMAL HIGH (ref 0–15)

## 2022-03-23 LAB — SERIAL MONITORING

## 2022-03-24 ENCOUNTER — Other Ambulatory Visit: Payer: Self-pay | Admitting: Gastroenterology

## 2022-03-24 LAB — ADALIMUMAB+AB (SERIAL MONITOR)
Adalimumab Drug Level: 6.9 ug/mL
Anti-Adalimumab Antibody: 217 ng/mL

## 2022-03-25 ENCOUNTER — Encounter: Payer: Self-pay | Admitting: Internal Medicine

## 2022-03-25 NOTE — Telephone Encounter (Signed)
Patient was last seen by Dr. Carlean Purl. Based on most recent labs, biologic therapy will likely be changed.

## 2022-03-25 NOTE — Telephone Encounter (Signed)
I called the patient and reviewed treatment options.  He is failing Humira, he has an intermediate level of antibodies.  Options include adding 6-MP to try to reduce antibodies, changing to Stelara trying Remicade with 6-MP or small molecules.  Small molecules, written Volk run some increased risk of thromboembolic problems and given his central retinal vein occlusion issue would avoid that at this point.  Will talk to his ophthalmologist regarding the category of his central retinal vein occlusion, if it was the main vein with thrombus then I think a hypercoagulable workup would make sense and we would order.  Zeposiacan cause macular edema and given current eye problems would avoid that also.  So the plan is to:  #1 initiate Stelara probably for January, he has a trial coming up and that would not be good timing.  #2 he is on prednisone up to 40 mg daily and is starting to see resolution of his symptoms he will taper to 30 when he feels ready and able.  #3 I am going to reach out to Dr. Baird Cancer at [facility] retina specialist to try to understand more about his central retinal vein occlusion i.e. is it branched and distal occlusion versus the entire central retinal vein if so (latter) will pursue hypercoagulable workup.

## 2022-03-28 ENCOUNTER — Other Ambulatory Visit: Payer: Self-pay

## 2022-03-28 DIAGNOSIS — N522 Drug-induced erectile dysfunction: Secondary | ICD-10-CM

## 2022-03-28 MED ORDER — STENDRA 200 MG PO TABS: ORAL_TABLET | ORAL | 10 refills | Status: DC

## 2022-04-01 ENCOUNTER — Other Ambulatory Visit: Payer: Self-pay | Admitting: Internal Medicine

## 2022-04-01 DIAGNOSIS — F411 Generalized anxiety disorder: Secondary | ICD-10-CM

## 2022-04-05 ENCOUNTER — Ambulatory Visit: Payer: 59 | Admitting: Internal Medicine

## 2022-04-06 ENCOUNTER — Telehealth: Payer: Self-pay | Admitting: Pharmacy Technician

## 2022-04-06 ENCOUNTER — Other Ambulatory Visit: Payer: Self-pay | Admitting: Internal Medicine

## 2022-04-06 MED ORDER — STELARA 90 MG/ML ~~LOC~~ SOSY: 90.0000 mg | PREFILLED_SYRINGE | SUBCUTANEOUS | 5 refills | Status: DC

## 2022-04-06 NOTE — Progress Notes (Signed)
Starting Stelara - stopping Humira as not controlling disease  Patient not able to start Stelara until January most likely  Maintenance Rx sent also - patient will self inject

## 2022-04-06 NOTE — Telephone Encounter (Signed)
Dr. Carlean Purl, Dala Dock note:  Auth Submission: APPROVED Payer: [employer] Medication & CPT/J Code(s) submitted: Stelara Infusion (Ustekinumab) [phone] Route of submission (phone, fax, portal): PORTAL Phone # Fax # Auth type: Buy/Bill Units/visits requested: X1 DOSE Reference number: [id number] Approval from: 04/06/22 to 05/07/22   Co-pay card: APPROVED ID: [id number] GR: [id number] BIN: [id number] EXP: 04/24/22 - auto re-enroll Amount: $9100  @[email or web address]  Please submit pharmacy auth benefit for Stelara SQ inj.  Thanks Maudie Mercury

## 2022-04-07 ENCOUNTER — Telehealth: Payer: Self-pay

## 2022-04-07 ENCOUNTER — Other Ambulatory Visit: Payer: Self-pay

## 2022-04-07 DIAGNOSIS — H348122 Central retinal vein occlusion, left eye, stable: Secondary | ICD-10-CM

## 2022-04-07 NOTE — Telephone Encounter (Signed)
Referral was sent to hematology at cancer center - Dr. Lorenso Courier  My Chart message sent to pt and made aware:

## 2022-04-07 NOTE — Telephone Encounter (Signed)
referral to hematology Received: [name], [name], MD  [name], RN Please refer to hematology at cancer center - Dr. Lorenso Courier if possible  Reason is please evaluate for possible hypercoaguable syndrome in patient with central retinal vein occlusion  January is first available for patient

## 2022-04-11 ENCOUNTER — Telehealth: Payer: Self-pay | Admitting: Hematology and Oncology

## 2022-04-11 NOTE — Telephone Encounter (Signed)
Scheduled appointment per referral. Patient is aware of appointment date and time. Patient is aware to arrive 15 mins prior to appointment time and to bring updated insurance cards. Patient is aware of location.

## 2022-04-12 ENCOUNTER — Other Ambulatory Visit (HOSPITAL_COMMUNITY): Payer: Self-pay

## 2022-04-12 ENCOUNTER — Telehealth: Payer: Self-pay | Admitting: Pharmacy Technician

## 2022-04-12 NOTE — Telephone Encounter (Signed)
Returned patient call in regards to co-pay card. Left v/m. Awaiting call back.

## 2022-04-12 NOTE — Telephone Encounter (Signed)
Patient Advocate Encounter  Received notification from [employer] that prior authorization for [facility] 90MG is required.   PA submitted on 12.19.23 VIA FAX: [phone] Status is pending

## 2022-04-13 ENCOUNTER — Other Ambulatory Visit: Payer: Self-pay | Admitting: Internal Medicine

## 2022-04-13 DIAGNOSIS — F411 Generalized anxiety disorder: Secondary | ICD-10-CM

## 2022-04-20 ENCOUNTER — Other Ambulatory Visit (HOSPITAL_COMMUNITY): Payer: Self-pay

## 2022-04-20 NOTE — Telephone Encounter (Signed)
Patient Advocate Encounter  Prior Authorization for [facility] has been approved.    PA# [id number] Effective dates: 12.20.23 through 6.20.24

## 2022-04-21 ENCOUNTER — Other Ambulatory Visit: Payer: Self-pay

## 2022-04-21 NOTE — Telephone Encounter (Signed)
Pt was  notified that his stelara 90 mg has been approved: Pt stated that Milo has already reached to him: Pt verbalized understanding with all questions answered.

## 2022-04-27 ENCOUNTER — Ambulatory Visit (INDEPENDENT_AMBULATORY_CARE_PROVIDER_SITE_OTHER): Payer: 59 | Admitting: *Deleted

## 2022-04-27 VITALS — BP 144/77 | HR 71 | Temp 99.0°F | Resp 18 | Ht 69.0 in | Wt 177.2 lb

## 2022-04-27 DIAGNOSIS — K51019 Ulcerative (chronic) pancolitis with unspecified complications: Secondary | ICD-10-CM

## 2022-04-27 MED ORDER — USTEKINUMAB 130 MG/26ML IV SOLN
390.0000 mg | Freq: Once | INTRAVENOUS | Status: AC
  Administered 2022-04-27: 390 mg via INTRAVENOUS
  Filled 2022-04-27: qty 78

## 2022-04-27 NOTE — Progress Notes (Signed)
Diagnosis: Crohn's Disease  Provider:  Marshell Garfinkel MD  Procedure: Infusion  IV Type: Peripheral, IV Location: L Antecubital  Stelera (Ustekinumab), Dose: 390 mg  Infusion Start Time: 6295 am  Infusion Stop Time: 1130 am  Post Infusion IV Care: Observation period completed and Peripheral IV Discontinued  Discharge: Condition: Good, Destination: Home . AVS provided to patient.   Performed by:  Oren Beckmann, RN

## 2022-05-09 ENCOUNTER — Other Ambulatory Visit: Payer: Self-pay | Admitting: Internal Medicine

## 2022-05-09 ENCOUNTER — Other Ambulatory Visit: Payer: Self-pay | Admitting: Gastroenterology

## 2022-05-09 DIAGNOSIS — F411 Generalized anxiety disorder: Secondary | ICD-10-CM

## 2022-05-09 MED ORDER — PREDNISONE 10 MG PO TABS: 20.0000 mg | ORAL_TABLET | Freq: Every day | ORAL | 1 refills | Status: DC

## 2022-05-09 NOTE — Telephone Encounter (Signed)
This is a patient of Dr Ardis Hughs.  It looks as if you have been assisting this patient with refills in Dr Ardis Hughs absence .  Please advise refills.  Thank you

## 2022-05-09 NOTE — Telephone Encounter (Signed)
Rx refill denied and new Rx w/ 10 mg tabs sent I have been communicating w/ him by My Chart

## 2022-05-11 ENCOUNTER — Inpatient Hospital Stay: Payer: 59

## 2022-05-11 ENCOUNTER — Inpatient Hospital Stay: Payer: 59 | Attending: Hematology and Oncology | Admitting: Hematology and Oncology

## 2022-05-11 ENCOUNTER — Telehealth: Payer: Self-pay

## 2022-05-11 VITALS — BP 119/84 | HR 64 | Temp 97.8°F | Resp 16 | Ht 69.0 in | Wt 177.7 lb

## 2022-05-11 DIAGNOSIS — Z8042 Family history of malignant neoplasm of prostate: Secondary | ICD-10-CM | POA: Diagnosis not present

## 2022-05-11 DIAGNOSIS — Z79899 Other long term (current) drug therapy: Secondary | ICD-10-CM | POA: Insufficient documentation

## 2022-05-11 DIAGNOSIS — Z803 Family history of malignant neoplasm of breast: Secondary | ICD-10-CM | POA: Insufficient documentation

## 2022-05-11 DIAGNOSIS — Z808 Family history of malignant neoplasm of other organs or systems: Secondary | ICD-10-CM | POA: Insufficient documentation

## 2022-05-11 DIAGNOSIS — H348122 Central retinal vein occlusion, left eye, stable: Secondary | ICD-10-CM | POA: Insufficient documentation

## 2022-05-11 DIAGNOSIS — Z800 Family history of malignant neoplasm of digestive organs: Secondary | ICD-10-CM | POA: Insufficient documentation

## 2022-05-11 DIAGNOSIS — K5190 Ulcerative colitis, unspecified, without complications: Secondary | ICD-10-CM | POA: Diagnosis not present

## 2022-05-11 DIAGNOSIS — F17290 Nicotine dependence, other tobacco product, uncomplicated: Secondary | ICD-10-CM | POA: Diagnosis not present

## 2022-05-11 DIAGNOSIS — F429 Obsessive-compulsive disorder, unspecified: Secondary | ICD-10-CM | POA: Insufficient documentation

## 2022-05-11 DIAGNOSIS — F419 Anxiety disorder, unspecified: Secondary | ICD-10-CM | POA: Insufficient documentation

## 2022-05-11 DIAGNOSIS — Z8379 Family history of other diseases of the digestive system: Secondary | ICD-10-CM | POA: Diagnosis not present

## 2022-05-11 LAB — CMP (CANCER CENTER ONLY)
ALT: 21 U/L (ref 0–44)
AST: 18 U/L (ref 15–41)
Albumin: 4.3 g/dL (ref 3.5–5.0)
Alkaline Phosphatase: 46 U/L (ref 38–126)
Anion gap: 6 (ref 5–15)
BUN: 16 mg/dL (ref 6–20)
CO2: 29 mmol/L (ref 22–32)
Calcium: 9.9 mg/dL (ref 8.9–10.3)
Chloride: 101 mmol/L (ref 98–111)
Creatinine: 1.02 mg/dL (ref 0.61–1.24)
GFR, Estimated: >60 mL/min (ref >60)
Glucose, Bld: 98 mg/dL (ref 70–99)
Potassium: 4 mmol/L (ref 3.5–5.1)
Sodium: 136 mmol/L (ref 135–145)
Total Bilirubin: 0.5 mg/dL (ref 0.3–1.2)
Total Protein: 7.1 g/dL (ref 6.5–8.1)

## 2022-05-11 LAB — CBC WITH DIFFERENTIAL (CANCER CENTER ONLY)
Abs Immature Granulocytes: 0.03 10*3/uL (ref 0.00–0.07)
Basophils Absolute: 0.1 10*3/uL (ref 0.0–0.1)
Basophils Relative: 0 %
Eosinophils Absolute: 0.1 10*3/uL (ref 0.0–0.5)
Eosinophils Relative: 1 %
HCT: 42.5 % (ref 39.0–52.0)
Hemoglobin: 14.9 g/dL (ref 13.0–17.0)
Immature Granulocytes: 0 %
Lymphocytes Relative: 9 %
Lymphs Abs: 1.4 10*3/uL (ref 0.7–4.0)
MCH: 30.4 pg (ref 26.0–34.0)
MCHC: 35.1 g/dL (ref 30.0–36.0)
MCV: 86.7 fL (ref 80.0–100.0)
Monocytes Absolute: 0.5 10*3/uL (ref 0.1–1.0)
Monocytes Relative: 3 %
Neutro Abs: 13.2 10*3/uL — ABNORMAL HIGH (ref 1.7–7.7)
Neutrophils Relative %: 87 %
Platelet Count: 285 10*3/uL (ref 150–400)
RBC: 4.9 MIL/uL (ref 4.22–5.81)
RDW: 12.7 % (ref 11.5–15.5)
WBC Count: 15.1 10*3/uL — ABNORMAL HIGH (ref 4.0–10.5)
nRBC: 0 % (ref 0.0–0.2)

## 2022-05-11 LAB — ANTITHROMBIN III: AntiThromb III Func: 121 % — ABNORMAL HIGH (ref 75–120)

## 2022-05-11 NOTE — Telephone Encounter (Signed)
Appointment Received: 2 days ago Gatha Mayer, MD  [name], RN Please make this patient an appointment to see me in March.  I think you could reach out there him by MyChart to find out what would work as he uses that.  Thanks  CEG

## 2022-05-11 NOTE — Telephone Encounter (Signed)
MyChart message sent to Pt.

## 2022-05-11 NOTE — Progress Notes (Signed)
[facility] Telephone:([phone]   Fax:([phone]) [phone]  INITIAL CONSULT NOTE  Patient Care Team: Etta Grandchild, MD as PCP - General (Internal Medicine)  Hematological/Oncological History # Central Retinal Vein Occlusion of Left Eye  10/03/2021: MR brain and orbits show no abnormalities 05/11/2022: establish care with Dr. Leonides Schanz   CHIEF COMPLAINTS/PURPOSE OF CONSULTATION:  "Central Retinal Vein Occlusion of Left Eye  "  HISTORY OF PRESENTING ILLNESS:  Blake Chapman 37 y.o. male with medical history significant for central retinal vein occlusion of the left eye who presents for a hypercoagulation evaluation.   On review of the previous records Blake Chapman developed a central retinal vein occlusion of the left eye in June 2023.  He has been under the care of of ophthalmology since that time.  He underwent MRI brain and orbits on 10/03/2021 which showed no concerning abnormalities.  Due to concern for possible hypercoagulable state causing his central retinal vein occlusion he was referred to hematology for further evaluation and management.  On exam today Blake Chapman reports that he has no prior history of blood clots.  He reports in early June 2023 he lost vision in the center of his left eye.  He thought it was a migraine.  After a few days with did not improve he saw his PCP at which time he underwent more extensive testing including MRI and retinal surgery evaluation.  He notes that he is been undergoing treatments with injection to the eye every 4 weeks and is now getting it every 7 weeks.  This is also thought to to have been related to his ulcerative colitis which is currently under the care of Dr. Leone Payor.  He notes he has been having issues with flares over the last years been taking a lot of high-dose prednisone.  He reports that he has never had any blood clots and is never been on any blood thinners.  He notes that he does suffer from anxiety, OCD, and prior heavy  alcohol use.  On further discussion he notes that his family history is markable for pulmonary embolism in his mother in 2010 likely caused by hip replacement.  His mother also had thyroid cancer in 2010.  His maternal uncle had prostate cancer and his maternal grandmother had breast cancer and colon cancer.  He reports that he did smoke cigars and pipes but he has not done this for quite some time.  He notes that he currently works as an Pensions consultant in Teacher, English as a foreign language.  He notes that he does have some residual visual changes but no headache, vision changes in his right eye, nausea, vomiting or diarrhea.  A full 10 point ROS was otherwise negative.  MEDICAL HISTORY:  Past Medical History:  Diagnosis Date   Allergy    Anxiety    Central retinal vein occlusion of left eye 03/14/2022   Depression    GERD (gastroesophageal reflux disease)    Neuromuscular disorder (HCC)    acute bil lower legs neuropathy   Substance abuse (HCC)    alcohol - previous   Universal ulcerative colitis (HCC)     SURGICAL HISTORY: Past Surgical History:  Procedure Laterality Date   clavical - right repair     2010   HARDWARE REMOVAL  2011   HERNIA REPAIR  1991    SOCIAL HISTORY: Social History   Socioeconomic History   Marital status: Single    Spouse name: Not on file   Number of children: 0  Years of education: Not on file   Highest education level: Not on file  Occupational History   Occupation: Attorney  Tobacco Use   Smoking status: Light Smoker    Types: Cigars   Smokeless tobacco: Never   Tobacco comments:    1 per day  Vaping Use   Vaping Use: Never used  Substance and Sexual Activity   Alcohol use: No    Alcohol/week: 0.0 standard drinks of alcohol    Comment: Alcoholism- remission - Oct 2015   Drug use: No   Sexual activity: Yes    Partners: Female    Birth control/protection: Condom  Other Topics Concern   Not on file  Social History Narrative   Single, he is a patent and Brewing technologist   Alcoholism in remission   Cigar smoker occasionally   No drug use   Social Determinants of Radio broadcast assistant Strain: Not on file  Food Insecurity: Not on file  Transportation Needs: Not on file  Physical Activity: Not on file  Stress: Not on file  Social Connections: Not on file  Intimate Partner Violence: Not on file    FAMILY HISTORY: Family History  Problem Relation Age of Onset   Thyroid cancer Mother    Crohn's disease Mother    Breast cancer Maternal Grandmother    Colon cancer Maternal Grandmother 97       died 2 weeks later   Esophageal cancer Maternal Grandfather        died mid 25's   Prostate cancer Maternal Uncle    Cancer Neg Hx    Alcohol abuse Neg Hx    Depression Neg Hx    Drug abuse Neg Hx    Early death Neg Hx    Stroke Neg Hx    Kidney disease Neg Hx    Hypertension Neg Hx    Hyperlipidemia Neg Hx    Pancreatic cancer Neg Hx    Rectal cancer Neg Hx    Stomach cancer Neg Hx     ALLERGIES:  has No Known Allergies.  MEDICATIONS:  Current Outpatient Medications  Medication Sig Dispense Refill   Ascorbic Acid (VITAMIN C) 100 MG tablet Take 100 mg by mouth daily.     Avanafil (STENDRA) 200 MG TABS TAKE 1 TABLET BY MOUTH ONCE DAILY 15 TO 30 MINUTES BEFORE INTERCOURSE AS DIRECTED 6 tablet 10   DULoxetine (CYMBALTA) 60 MG capsule TAKE 1 CAPSULE BY MOUTH EVERY DAY 90 capsule 1   fluticasone (FLONASE) 50 MCG/ACT nasal spray Place into both nostrils daily.     Loperamide HCl (IMODIUM PO) Take by mouth 2 (two) times daily.     Loratadine (CLARITIN PO) Take by mouth.     Multiple Vitamin (MULTIVITAMIN) capsule Take 1 capsule by mouth daily.     omeprazole (PRILOSEC) 10 MG capsule Take 10 mg by mouth daily.     predniSONE (DELTASONE) 10 MG tablet Take 2 tablets (20 mg total) by mouth daily with breakfast. 90 tablet 1   propranolol (INDERAL) 10 MG tablet TAKE 1 TABLET BY MOUTH EVERY DAY AS NEEDED 90 tablet 0   traZODone (DESYREL) 50 MG  tablet TAKE 1 TABLET BY MOUTH EVERY DAY AT BEDTIME AS NEEDED 90 tablet 1   ustekinumab (STELARA) 90 MG/ML SOSY injection Inject 1 mL (90 mg total) into the skin every 8 (eight) weeks. 1 mL 5   VITAMIN D, CHOLECALCIFEROL, PO Take by mouth.     No current facility-administered medications for  this visit.    REVIEW OF SYSTEMS:   Constitutional: ( - ) fevers, ( - )  chills , ( - ) night sweats Eyes: ( - ) blurriness of vision, ( - ) double vision, ( - ) watery eyes Ears, nose, mouth, throat, and face: ( - ) mucositis, ( - ) sore throat Respiratory: ( - ) cough, ( - ) dyspnea, ( - ) wheezes Cardiovascular: ( - ) palpitation, ( - ) chest discomfort, ( - ) lower extremity swelling Gastrointestinal:  ( - ) nausea, ( - ) heartburn, ( - ) change in bowel habits Skin: ( - ) abnormal skin rashes Lymphatics: ( - ) new lymphadenopathy, ( - ) easy bruising Neurological: ( - ) numbness, ( - ) tingling, ( - ) new weaknesses Behavioral/Psych: ( - ) mood change, ( - ) new changes  All other systems were reviewed with the patient and are negative.  PHYSICAL EXAMINATION:  Vitals:   05/11/22 0917  BP: 119/84  Pulse: 64  Resp: 16  Temp: 97.8 F (36.6 C)  SpO2: 100%   Filed Weights   05/11/22 0917  Weight: 177 lb 11.2 oz (80.6 kg)    GENERAL: well appearing Caucasian male in NAD  SKIN: skin color, texture, turgor are normal, no rashes or significant lesions EYES: conjunctiva are pink and non-injected, sclera clear LUNGS: clear to auscultation and percussion with normal breathing effort HEART: regular rate & rhythm and no murmurs and no lower extremity edema Musculoskeletal: no cyanosis of digits and no clubbing  PSYCH: alert & oriented x 3, fluent speech NEURO: no focal motor/sensory deficits  LABORATORY DATA:  I have reviewed the data as listed    Latest Ref Rng & Units 03/16/2022   11:48 AM 05/11/2021    2:33 PM 05/13/2020    1:58 PM  CBC  WBC 4.0 - 10.5 K/uL 11.9  8.7  8.5   Hemoglobin  13.0 - 17.0 g/dL 24.4  97.5  30.0   Hematocrit 39.0 - 52.0 % 40.3  35.4  38.5   Platelets 150.0 - 400.0 K/uL 259.0  235.0  317.0        Latest Ref Rng & Units 03/16/2022   11:48 AM 05/11/2021    2:33 PM 05/13/2020    1:58 PM  CMP  Glucose 70 - 99 mg/dL 511  021  84   BUN 6 - 23 mg/dL 14  11  10    Creatinine 0.40 - 1.50 mg/dL  1.17  3.56   Sodium 135 - 145 mEq/L 137  139  137   Potassium 3.5 - 5.1 mEq/L 3.7  3.8  4.1   Chloride 96 - 112 mEq/L 103  102  100   CO2 19 - 32 mEq/L 28  27  30    Calcium 8.4 - 10.5 mg/dL 7.01  9.1  9.9   Total Protein 6.0 - 8.3 g/dL 7.2  7.0  7.5   Total Bilirubin 0.2 - 1.2 mg/dL 0.5  0.3  0.3   Alkaline Phos 39 - 117 U/L 45  58  61   AST 0 - 37 U/L 16  28  21    ALT 0 - 53 U/L 19  29  17       ASSESSMENT & PLAN Blake Chapman 37 y.o. male with medical history significant for central retinal vein occlusion of the left eye who presents for a hypercoagulation evaluation.   After review of the labs, review of the records, and discussion with the  patient the patients findings are most consistent with a central retinal vein occlusion of the left eye.  At this time the etiology is unclear but may be related to his ulcerative colitis.  In order to rule out a hypercoagulable condition we will order a full hypercoagulation panel.  The patient voiced understanding of our plan moving forward.  # Central Retinal Vein Occlusion of Left Eye  --will perform hypercoagulation workup to include FVL, Prothrombin Gene mutation, Protein S and C levels, and APS studies. Will also order homocysteine and antithrombin III levels --continue management per ophthalmology -- if no hypercoagulation issues are noted there is no need for routine f/u in our clinic.   Orders Placed This Encounter  Procedures   CBC with Differential ([place] Only)    Standing Status:   Future    Standing Expiration Date:   05/12/2023   CMP ([place] only)    Standing Status:   Future     Standing Expiration Date:   05/12/2023   Antithrombin III   Protein C activity   Protein C, total   Protein S activity   Protein S, total   Lupus anticoagulant panel   Beta-2-glycoprotein i abs, IgG/M/A   Homocysteine, serum   Factor 5 leiden   Prothrombin gene mutation   Cardiolipin antibodies, IgG, IgM, IgA    All questions were answered. The patient knows to call the clinic with any problems, questions or concerns.  A total of more than 60 minutes were spent on this encounter with face-to-face time and non-face-to-face time, including preparing to see the patient, ordering tests and/or medications, counseling the patient and coordination of care as outlined above.   Ledell Peoples, MD Department of Hematology/Oncology [place] [facility] Phone: [phone] Pager: [phone] Email: [email or web address]  05/11/2022 9:50 AM

## 2022-05-12 LAB — HOMOCYSTEINE: Homocysteine: 14.8 umol/L — ABNORMAL HIGH (ref 0.0–14.5)

## 2022-05-13 LAB — LUPUS ANTICOAGULANT PANEL
DRVVT: 38.9 s (ref 0.0–47.0)
PTT Lupus Anticoagulant: 46.2 s — ABNORMAL HIGH (ref 0.0–43.5)

## 2022-05-13 LAB — CARDIOLIPIN ANTIBODIES, IGG, IGM, IGA
Anticardiolipin IgA: <9 APL U/mL (ref 0–11)
Anticardiolipin IgG: <9 GPL U/mL (ref 0–14)
Anticardiolipin IgM: <9 MPL U/mL (ref 0–12)

## 2022-05-13 LAB — BETA-2-GLYCOPROTEIN I ABS, IGG/M/A
Beta-2 Glyco I IgG: <9 GPI IgG units (ref 0–20)
Beta-2-Glycoprotein I IgA: <9 GPI IgA units (ref 0–25)
Beta-2-Glycoprotein I IgM: <9 GPI IgM units (ref 0–32)

## 2022-05-13 LAB — PTT-LA MIX: PTT-LA Mix: 43.5 s — ABNORMAL HIGH (ref 0.0–40.5)

## 2022-05-13 LAB — PROTEIN C, TOTAL: Protein C, Total: 122 % (ref 60–150)

## 2022-05-13 LAB — PROTEIN C ACTIVITY: Protein C Activity: 155 % (ref 73–180)

## 2022-05-13 LAB — PROTEIN S, TOTAL: Protein S Ag, Total: 127 % (ref 60–150)

## 2022-05-13 LAB — HEXAGONAL PHASE PHOSPHOLIPID: Hexagonal Phase Phospholipid: 9 s (ref 0–11)

## 2022-05-13 LAB — PROTEIN S ACTIVITY: Protein S Activity: 95 % (ref 63–140)

## 2022-05-13 NOTE — Telephone Encounter (Signed)
Left message for pt to call back

## 2022-05-16 ENCOUNTER — Encounter: Payer: Self-pay | Admitting: Internal Medicine

## 2022-05-16 ENCOUNTER — Ambulatory Visit (INDEPENDENT_AMBULATORY_CARE_PROVIDER_SITE_OTHER): Payer: 59 | Admitting: Internal Medicine

## 2022-05-16 VITALS — BP 152/92 | HR 116 | Temp 98.9°F | Ht 69.0 in | Wt 178.0 lb

## 2022-05-16 DIAGNOSIS — Z0000 Encounter for general adult medical examination without abnormal findings: Secondary | ICD-10-CM | POA: Diagnosis not present

## 2022-05-16 DIAGNOSIS — R030 Elevated blood-pressure reading, without diagnosis of hypertension: Secondary | ICD-10-CM

## 2022-05-16 DIAGNOSIS — I119 Hypertensive heart disease without heart failure: Secondary | ICD-10-CM | POA: Diagnosis not present

## 2022-05-16 MED ORDER — NEBIVOLOL HCL 5 MG PO TABS: 5.0000 mg | ORAL_TABLET | Freq: Every day | ORAL | 0 refills | Status: DC

## 2022-05-16 NOTE — Patient Instructions (Signed)
Health Maintenance, Male Adopting a healthy lifestyle and getting preventive care are important in promoting health and wellness. Ask your health care provider about: The right schedule for you to have regular tests and exams. Things you can do on your own to prevent diseases and keep yourself healthy. What should I know about diet, weight, and exercise? Eat a healthy diet  Eat a diet that includes plenty of vegetables, fruits, low-fat dairy products, and lean protein. Do not eat a lot of foods that are high in solid fats, added sugars, or sodium. Maintain a healthy weight Body mass index (BMI) is a measurement that can be used to identify possible weight problems. It estimates body fat based on height and weight. Your health care provider can help determine your BMI and help you achieve or maintain a healthy weight. Get regular exercise Get regular exercise. This is one of the most important things you can do for your health. Most adults should: Exercise for at least 150 minutes each week. The exercise should increase your heart rate and make you sweat (moderate-intensity exercise). Do strengthening exercises at least twice a week. This is in addition to the moderate-intensity exercise. Spend less time sitting. Even light physical activity can be beneficial. Watch cholesterol and blood lipids Have your blood tested for lipids and cholesterol at 39 years of age, then have this test every 5 years. You may need to have your cholesterol levels checked more often if: Your lipid or cholesterol levels are high. You are older than 40 years of age. You are at high risk for heart disease. What should I know about cancer screening? Many types of cancers can be detected early and may often be prevented. Depending on your health history and family history, you may need to have cancer screening at various ages. This may include screening for: Colorectal cancer. Prostate cancer. Skin cancer. Lung  cancer. What should I know about heart disease, diabetes, and high blood pressure? Blood pressure and heart disease High blood pressure causes heart disease and increases the risk of stroke. This is more likely to develop in people who have high blood pressure readings or are overweight. Talk with your health care provider about your target blood pressure readings. Have your blood pressure checked: Every 3-5 years if you are 18-39 years of age. Every year if you are 40 years old or older. If you are between the ages of 65 and 75 and are a current or former smoker, ask your health care provider if you should have a one-time screening for abdominal aortic aneurysm (AAA). Diabetes Have regular diabetes screenings. This checks your fasting blood sugar level. Have the screening done: Once every three years after age 45 if you are at a normal weight and have a low risk for diabetes. More often and at a younger age if you are overweight or have a high risk for diabetes. What should I know about preventing infection? Hepatitis B If you have a higher risk for hepatitis B, you should be screened for this virus. Talk with your health care provider to find out if you are at risk for hepatitis B infection. Hepatitis C Blood testing is recommended for: Everyone born from 1945 through 1965. Anyone with known risk factors for hepatitis C. Sexually transmitted infections (STIs) You should be screened each year for STIs, including gonorrhea and chlamydia, if: You are sexually active and are younger than 39 years of age. You are older than 39 years of age and your   health care provider tells you that you are at risk for this type of infection. Your sexual activity has changed since you were last screened, and you are at increased risk for chlamydia or gonorrhea. Ask your health care provider if you are at risk. Ask your health care provider about whether you are at high risk for HIV. Your health care provider  may recommend a prescription medicine to help prevent HIV infection. If you choose to take medicine to prevent HIV, you should first get tested for HIV. You should then be tested every 3 months for as long as you are taking the medicine. Follow these instructions at home: Alcohol use Do not drink alcohol if your health care provider tells you not to drink. If you drink alcohol: Limit how much you have to 0-2 drinks a day. Know how much alcohol is in your drink. In the [place] one drink equals one 12 oz bottle of beer (355 mL), one 5 oz glass of wine (148 mL), or one 1 oz glass of hard liquor (44 mL). Lifestyle Do not use any products that contain nicotine or tobacco. These products include cigarettes, chewing tobacco, and vaping devices, such as e-cigarettes. If you need help quitting, ask your health care provider. Do not use street drugs. Do not share needles. Ask your health care provider for help if you need support or information about quitting drugs. General instructions Schedule regular health, dental, and eye exams. Stay current with your vaccines. Tell your health care provider if: You often feel depressed. You have ever been abused or do not feel safe at home. Summary Adopting a healthy lifestyle and getting preventive care are important in promoting health and wellness. Follow your health care provider's instructions about healthy diet, exercising, and getting tested or screened for diseases. Follow your health care provider's instructions on monitoring your cholesterol and blood pressure. This information is not intended to replace advice given to you by your health care provider. Make sure you discuss any questions you have with your health care provider. Document Revised: 08/31/2020 Document Reviewed: 08/31/2020 Elsevier Patient Education  2023 [employer]  

## 2022-05-16 NOTE — Progress Notes (Signed)
Subjective:  Patient ID: Blake Chapman, male    DOB: 07-22-1985  Age: 37 y.o. MRN: [id number]  CC: Annual Exam and Hypertension   HPI Elim Economou presents for a CPX and f/up --   He has recently been told that his blood pressure is high.  He is active and denies chest pain, shortness of breath, diaphoresis, or edema.  He takes propranolol as needed for performance anxiety..   Outpatient Medications Prior to Visit  Medication Sig Dispense Refill   Avanafil (STENDRA) 200 MG TABS TAKE 1 TABLET BY MOUTH ONCE DAILY 15 TO 30 MINUTES BEFORE INTERCOURSE AS DIRECTED 6 tablet 10   DULoxetine (CYMBALTA) 60 MG capsule TAKE 1 CAPSULE BY MOUTH EVERY DAY 90 capsule 1   fluticasone (FLONASE) 50 MCG/ACT nasal spray Place into both nostrils daily.     Loperamide HCl (IMODIUM PO) Take by mouth 2 (two) times daily.     Loratadine (CLARITIN PO) Take by mouth.     Multiple Vitamin (MULTIVITAMIN) capsule Take 1 capsule by mouth daily.     omeprazole (PRILOSEC) 10 MG capsule Take 10 mg by mouth daily.     predniSONE (DELTASONE) 10 MG tablet Take 2 tablets (20 mg total) by mouth daily with breakfast. 90 tablet 1   traZODone (DESYREL) 50 MG tablet TAKE 1 TABLET BY MOUTH EVERY DAY AT BEDTIME AS NEEDED 90 tablet 1   ustekinumab (STELARA) 90 MG/ML SOSY injection Inject 1 mL (90 mg total) into the skin every 8 (eight) weeks. 1 mL 5   VITAMIN D, CHOLECALCIFEROL, PO Take by mouth.     Ascorbic Acid (VITAMIN C) 100 MG tablet Take 100 mg by mouth daily.     propranolol (INDERAL) 10 MG tablet TAKE 1 TABLET BY MOUTH EVERY DAY AS NEEDED 90 tablet 0   No facility-administered medications prior to visit.    ROS Review of Systems  Constitutional: Negative.  Negative for diaphoresis, fatigue and fever.  HENT: Negative.    Eyes: Negative.   Respiratory:  Negative for cough, chest tightness, shortness of breath and wheezing.   Cardiovascular:  Negative for chest pain, palpitations and leg swelling.  Gastrointestinal:   Negative for abdominal pain, constipation, diarrhea, nausea and vomiting.  Endocrine: Negative.   Genitourinary: Negative.  Negative for difficulty urinating, dysuria and hematuria.  Musculoskeletal: Negative.  Negative for arthralgias and myalgias.  Skin: Negative.  Negative for color change.  Neurological: Negative.  Negative for dizziness, weakness and light-headedness.  Hematological:  Negative for adenopathy. Does not bruise/bleed easily.  Psychiatric/Behavioral: Negative.      Objective:  BP (!) 152/92 (BP Location: Right Arm, Patient Position: Sitting, Cuff Size: Large)   Pulse (!) 116   Temp 98.9 F (37.2 C) (Oral)   Ht 5\' 9"  (1.753 m)   Wt 178 lb (80.7 kg)   SpO2 97%   BMI 26.29 kg/m   BP Readings from Last 3 Encounters:  05/16/22 (!) 152/92  05/11/22 119/84  04/27/22 (!) 144/77    Wt Readings from Last 3 Encounters:  05/16/22 178 lb (80.7 kg)  05/11/22 177 lb 11.2 oz (80.6 kg)  04/27/22 177 lb 3.2 oz (80.4 kg)    Physical Exam Vitals reviewed.  Constitutional:      Appearance: He is not ill-appearing.  HENT:     Nose: Nose normal.     Mouth/Throat:     Mouth: Mucous membranes are moist.  Eyes:     General: No scleral icterus.    Conjunctiva/sclera: Conjunctivae normal.  Cardiovascular:     Rate and Rhythm: Regular rhythm. Tachycardia present.     Heart sounds: Normal heart sounds, S1 normal and S2 normal. No murmur heard.    Comments: EKG- NSR, 93 bpm LAE, LVH No Q waves or ST/T wave changes Pulmonary:     Breath sounds: No stridor. No wheezing, rhonchi or rales.  Abdominal:     General: Abdomen is flat.     Palpations: There is no mass.     Tenderness: There is no abdominal tenderness. There is no guarding or rebound.     Hernia: No hernia is present.  Musculoskeletal:     Cervical back: Neck supple.     Right lower leg: No edema.     Left lower leg: No edema.  Lymphadenopathy:     Cervical: No cervical adenopathy.  Skin:    General: Skin is  warm and dry.  Neurological:     General: No focal deficit present.     Mental Status: He is alert. Mental status is at baseline.  Psychiatric:        Mood and Affect: Mood normal.        Behavior: Behavior normal.     Lab Results  Component Value Date   WBC 15.1 (H) 05/11/2022   HGB 14.9 05/11/2022   HCT 42.5 05/11/2022   PLT 285 05/11/2022   GLUCOSE 98 05/11/2022   CHOL 164 05/13/2020   TRIG 216.0 (H) 05/13/2020   HDL 51.30 05/13/2020   LDLDIRECT 101.0 05/13/2020   LDLCALC 105 (H) 11/29/2017   ALT 21 05/11/2022   AST 18 05/11/2022   NA 136 05/11/2022   K 4.0 05/11/2022   CL 101 05/11/2022   CREATININE 1.02 05/11/2022   BUN 16 05/11/2022   CO2 29 05/11/2022   TSH 0.64 05/16/2022    MR ORBITS W WO CONTRAST  Result Date: 10/03/2021 CLINICAL DATA:  Left eye vision loss occurring 11 days ago. Dizziness. Headache. EXAM: MRI HEAD WITHOUT CONTRAST MRI ORBITS WITHOUT AND WITH CONTRAST TECHNIQUE: Multiplanar, multiecho pulse sequences of the brain and surrounding structures were obtained without intravenous contrast. Multiplanar, multiecho pulse sequences of the orbits and surrounding structures were obtained including fat saturation techniques, before and after intravenous contrast administration. CONTRAST:  61mL MULTIHANCE GADOBENATE DIMEGLUMINE 529 MG/ML IV SOLN COMPARISON:  None Available. FINDINGS: MRI HEAD FINDINGS Brain: There is no evidence of an acute infarct, intracranial hemorrhage, mass, midline shift, or extra-axial fluid collection. The ventricles and sulci are normal. The cerebellar tonsils are normally positioned. The pituitary gland is normal in size. The brain is normal in signal. Vascular: Major intracranial vascular flow voids are preserved. Skull and upper cervical spine: Unremarkable bone marrow signal. Other: None. MRI ORBITS FINDINGS Orbits: No evidence of edema or abnormal enhancement involving either optic nerve. No orbital mass or inflammation. Intact globes.  Symmetric and normal appearance of the extraocular muscles and lacrimal glands. Visualized sinuses: Paranasal sinuses and mastoid air cells are clear. Soft tissues: Unremarkable. IMPRESSION: Unremarkable appearance of the brain and orbits. Electronically Signed   By: Sebastian Ache M.D.   On: 10/03/2021 16:50   MR Brain Wo Contrast  Result Date: 10/03/2021 CLINICAL DATA:  Left eye vision loss occurring 11 days ago. Dizziness. Headache. EXAM: MRI HEAD WITHOUT CONTRAST MRI ORBITS WITHOUT AND WITH CONTRAST TECHNIQUE: Multiplanar, multiecho pulse sequences of the brain and surrounding structures were obtained without intravenous contrast. Multiplanar, multiecho pulse sequences of the orbits and surrounding structures were obtained including fat saturation techniques,  before and after intravenous contrast administration. CONTRAST:  73mL MULTIHANCE GADOBENATE DIMEGLUMINE 529 MG/ML IV SOLN COMPARISON:  None Available. FINDINGS: MRI HEAD FINDINGS Brain: There is no evidence of an acute infarct, intracranial hemorrhage, mass, midline shift, or extra-axial fluid collection. The ventricles and sulci are normal. The cerebellar tonsils are normally positioned. The pituitary gland is normal in size. The brain is normal in signal. Vascular: Major intracranial vascular flow voids are preserved. Skull and upper cervical spine: Unremarkable bone marrow signal. Other: None. MRI ORBITS FINDINGS Orbits: No evidence of edema or abnormal enhancement involving either optic nerve. No orbital mass or inflammation. Intact globes. Symmetric and normal appearance of the extraocular muscles and lacrimal glands. Visualized sinuses: Paranasal sinuses and mastoid air cells are clear. Soft tissues: Unremarkable. IMPRESSION: Unremarkable appearance of the brain and orbits. Electronically Signed   By: Logan Bores M.D.   On: 10/03/2021 16:50    Assessment & Plan:   Quanta was seen today for annual exam and hypertension.  Diagnoses and all  orders for this visit:  Routine general medical examination at a health care facility- Exam completed, labs reviewed, vaccines reviewed and updated, no cancer screenings indicated, patient education was given.  Elevated blood pressure reading in office without diagnosis of hypertension- Labs are negative for secondary causes.  He has LVH on the EKG.  Will start a daily beta-blocker and an ARB. -     Aldosterone + renin activity w/ ratio; Future -     Thyroid Panel With TSH; Future -     EKG 12-Lead -     Urinalysis, Routine w reflex microscopic; Future -     Urinalysis, Routine w reflex microscopic -     Thyroid Panel With TSH -     Aldosterone + renin activity w/ ratio  LVH (left ventricular hypertrophy) due to hypertensive disease, without heart failure -     ECHOCARDIOGRAM COMPLETE; Future -     nebivolol (BYSTOLIC) 5 MG tablet; Take 1 tablet (5 mg total) by mouth daily. -     olmesartan (BENICAR) 20 MG tablet; Take 1 tablet (20 mg total) by mouth daily.   I have discontinued Anav Lafortune's vitamin C and propranolol. I am also having him start on nebivolol and olmesartan. Additionally, I am having him maintain his multivitamin, Loratadine (CLARITIN PO), fluticasone, (VITAMIN D, CHOLECALCIFEROL, PO), omeprazole, Loperamide HCl (IMODIUM PO), Stendra, DULoxetine, Stelara, traZODone, and predniSONE.  Meds ordered this encounter  Medications   nebivolol (BYSTOLIC) 5 MG tablet    Sig: Take 1 tablet (5 mg total) by mouth daily.    Dispense:  90 tablet    Refill:  0   olmesartan (BENICAR) 20 MG tablet    Sig: Take 1 tablet (20 mg total) by mouth daily.    Dispense:  90 tablet    Refill:  0     Follow-up: Return in about 3 months (around 08/15/2022).  Scarlette Calico, MD

## 2022-05-16 NOTE — Telephone Encounter (Signed)
Pt was scheduled to see Dr. Carlean Purl: Documented in My Chart encounter

## 2022-05-17 LAB — URINALYSIS, ROUTINE W REFLEX MICROSCOPIC
Bilirubin Urine: NEGATIVE
Hgb urine dipstick: NEGATIVE
Ketones, ur: NEGATIVE
Leukocytes,Ua: NEGATIVE
Nitrite: NEGATIVE
RBC / HPF: NONE SEEN (ref 0–?)
Specific Gravity, Urine: 1.015 (ref 1.000–1.030)
Total Protein, Urine: NEGATIVE
Urine Glucose: NEGATIVE
Urobilinogen, UA: 0.2 (ref 0.0–1.0)
WBC, UA: NONE SEEN (ref 0–?)
pH: 7.5 (ref 5.0–8.0)

## 2022-05-17 LAB — FACTOR 5 LEIDEN

## 2022-05-17 MED ORDER — OLMESARTAN MEDOXOMIL 20 MG PO TABS: 20.0000 mg | ORAL_TABLET | Freq: Every day | ORAL | 0 refills | Status: DC

## 2022-05-19 LAB — PROTHROMBIN GENE MUTATION

## 2022-05-20 LAB — ALDOSTERONE + RENIN ACTIVITY W/ RATIO
ALDO / PRA Ratio: 3.3 Ratio (ref 0.9–28.9)
Aldosterone: 11 ng/dL
Renin Activity: 3.32 ng/mL/h (ref 0.25–5.82)

## 2022-05-20 LAB — THYROID PANEL WITH TSH
Free Thyroxine Index: 2.1 (ref 1.4–3.8)
T3 Uptake: 28 % (ref 22–35)
T4, Total: 7.4 ug/dL (ref 4.9–10.5)
TSH: 0.64 mIU/L (ref 0.40–4.50)

## 2022-06-15 ENCOUNTER — Telehealth (HOSPITAL_COMMUNITY): Payer: Self-pay | Admitting: Internal Medicine

## 2022-06-15 NOTE — Telephone Encounter (Signed)
We have attempted to contact the ordering providers office to obtain a prior authorization for the ordered test, Echocardiogram.  However, we have been unsuccesful. Order will be removed from the active order WQ. Once the prior authorization is obtained we will reinstate the order and schedule patient.    06/15/22 Order cancelled and message sent to MD due to no response from ordering  MD office /LBW  06/08/22 Inbasket sent for PA x 4  06/01/22 inbasket for PA# sent 05/24/22 Inbasket sent for PA# x 2 05/17/22 Inbasket sent for PA#        Thank you

## 2022-06-23 NOTE — Telephone Encounter (Signed)
Inbound call from patient stating he has received a bill for stelera . Patient states he is unaware of how much the medication would cost and feels if he knew he would no t have proceeded with the proceeded. Patient would like to speak with a nurse in regards. States he will follow up with his insurance company as well. Please give a call to further advise.  Thank you

## 2022-06-24 NOTE — Telephone Encounter (Signed)
Pt stated that he called multiple people yesterday in regard to this issue and that his assistance with me program had changed and he was given a new virtual payment card for him to notify his insurance and infusion center. Things seem to be moving in the right direction.  Pt questioned another bill for a lab test that was not covered. Pt to send  my chart details and a fax number where we can sent office notes to help try to get test covered by insurance.  Pt verbalized understanding with all questions answered.

## 2022-06-28 ENCOUNTER — Telehealth: Payer: Self-pay | Admitting: Internal Medicine

## 2022-06-28 NOTE — Telephone Encounter (Signed)
Error

## 2022-07-05 ENCOUNTER — Telehealth: Payer: Self-pay

## 2022-07-05 ENCOUNTER — Other Ambulatory Visit (INDEPENDENT_AMBULATORY_CARE_PROVIDER_SITE_OTHER): Payer: 59

## 2022-07-05 ENCOUNTER — Encounter: Payer: Self-pay | Admitting: Internal Medicine

## 2022-07-05 ENCOUNTER — Ambulatory Visit (INDEPENDENT_AMBULATORY_CARE_PROVIDER_SITE_OTHER): Payer: 59 | Admitting: Internal Medicine

## 2022-07-05 VITALS — BP 126/70 | HR 65 | Ht 69.0 in | Wt 176.0 lb

## 2022-07-05 DIAGNOSIS — K51019 Ulcerative (chronic) pancolitis with unspecified complications: Secondary | ICD-10-CM | POA: Diagnosis not present

## 2022-07-05 DIAGNOSIS — Z7960 Long term (current) use of unspecified immunomodulators and immunosuppressants: Secondary | ICD-10-CM

## 2022-07-05 DIAGNOSIS — Z7952 Long term (current) use of systemic steroids: Secondary | ICD-10-CM

## 2022-07-05 LAB — COMPREHENSIVE METABOLIC PANEL
ALT: 18 U/L (ref 0–53)
AST: 18 U/L (ref 0–37)
Albumin: 4.1 g/dL (ref 3.5–5.2)
Alkaline Phosphatase: 39 U/L (ref 39–117)
BUN: 18 mg/dL (ref 6–23)
CO2: 28 mEq/L (ref 19–32)
Calcium: 9.4 mg/dL (ref 8.4–10.5)
Chloride: 101 mEq/L (ref 96–112)
Creatinine, Ser: 1.09 mg/dL (ref 0.40–1.50)
GFR: 85.88 mL/min (ref >60.00)
Glucose, Bld: 107 mg/dL — ABNORMAL HIGH (ref 70–99)
Potassium: 4 mEq/L (ref 3.5–5.1)
Sodium: 137 mEq/L (ref 135–145)
Total Bilirubin: 0.4 mg/dL (ref 0.2–1.2)
Total Protein: 6.8 g/dL (ref 6.0–8.3)

## 2022-07-05 LAB — C-REACTIVE PROTEIN: CRP: <1 mg/dL (ref 0.5–20.0)

## 2022-07-05 MED ORDER — NA SULFATE-K SULFATE-MG SULF 17.5-3.13-1.6 GM/177ML PO SOLN: 1.0000 | ORAL | 0 refills | Status: DC

## 2022-07-05 NOTE — Telephone Encounter (Signed)
I spoke with [name] and he is going to call our x-ray department tomorrow at [phone] to set up his DEXA scan.

## 2022-07-05 NOTE — Telephone Encounter (Signed)
-----   Message from Gatha Mayer, MD sent at 07/05/2022  1:42 PM EDT ----- Regarding: DEXA I mention to the patient but forgot to tell you he needs a DEXA scan.   This is not urgent the diagnosis is long-term use of systemic steroids.\   Thank you

## 2022-07-05 NOTE — Patient Instructions (Addendum)
You have been scheduled for a colonoscopy. Please follow written instructions given to you at your visit today.  Please pick up your prep supplies at the pharmacy within the next 1-3 days. If you use inhalers (even only as needed), please bring them with you on the day of your procedure.  After 2 more weeks of the prednisone '40mg'$  go to '35mg'$  daily per Dr Carlean Purl.  Your provider has requested that you go to the basement level for lab work before leaving today. Press "B" on the elevator. The lab is located at the first door on the left as you exit the elevator.   Due to recent changes in healthcare laws, you may see the results of your imaging and laboratory studies on MyChart before your provider has had a chance to review them.  We understand that in some cases there may be results that are confusing or concerning to you. Not all laboratory results come back in the same time frame and the provider may be waiting for multiple results in order to interpret others.  Please give [place] 48 hours in order for your provider to thoroughly review all the results before contacting the office for clarification of your results.    I appreciate the opportunity to care for you. Silvano Rusk, MD, [facility]

## 2022-07-05 NOTE — Progress Notes (Signed)
[place] 37 y.o. [birth year] BY:[id number]  Assessment & Plan:   Encounter Diagnoses  Name Primary?   Universal ulcerative colitis with complication ([place]) Yes   Long-term use of immunosuppressant medication currently Humira    Patient is still requiring high-dose prednisone to control his symptoms of ulcerative colitis.  No clear effects from the infusion and first subcutaneous dose of Stelara.  It is too soon to say this has failed but he has been on high-dose steroids for over a year now.  Patient's yes I have considered RINVOQ  medication for him previously but we held on that due to his central retinal vein thrombosis though there is no hypercoagulable state based upon workup through hematology that was undertaken.  So that is an option.  He is on his third biologic and though it is too soon to say the Delsa Grana has failed as above, need to consider other options.  I did review that colectomy is curative though I am not recommending that at this time.  We briefly reviewed total colectomy with J-pouch anastomosis and temporary ileostomy.  Evaluate with colonoscopy to reassess  If he is doing well after 2 weeks of 40 mg prednisone he can try 35 mg.  Perhaps a slower taper will work better since going to 30 mg leads to diarrhea and concern.  Ideally we want him off steroids.  Now on antihypertensive.  DEXA scan needed for long-term use of steroids I had mentioned that to him in the office but neglected to schedule but we will get that done.  His vitamin D level was 96 in November 2023.   Subjective:   Review of pertinent gastrointestinal problems: 1. Distal UC; presented 2017 with intermittent rectal bleeding. Colonoscopy December 2017 Dr. Ardis Hughs found moderate inflammation from rectum up to 25 cm. The rest of the colon was normal. Terminal ileum could not be intubated. Biopsies from the inflamed segment showed chronic active inflammation. Biopsies from non-inflamed appearing segments of  colon in the right and left segments were all normal. He was started on mesalamine enema and mesalamine orally.  September 2018 flare, labs showed white blood cell count was 20k and I Put him on vancomycin, C. difficile by PCR was negative however. Eventual steroid pulse 40 mg once daily significantly helped him clinically. Aug 2019 flaring again, sed rate 54, required steroids again.  CT scan 11/2017 confirmed inflammation distal descending and sigmoid colon.  Stool testing neg for infection. New start entyvio 11/2017. Labs August 2019: HIV negative, hepatitis B negative, hepatitis C negative.  TB quant gold indeterminant followed up by normal chest x-ray, followed up by negative PPD. 01/2019: entyvio Antibodies negative; entyvio trough drug leve 5.7; increased dosing to every 6 weeks instead of every 8   Colonoscopy Ardis Hughs 07/20/2021-universal UC severe rectum to 30 cm mild throughout otherwise biopsies confirm and no signs of viral infection   Humira initiated late March early April 2023 40 mg every other week  Stelara initiated April 27, 2022 ---------------------------------------------------------------------------------------- Chief Complaint: Follow-up of ulcerative colitis now on Stelara  HPI 37 year old white man with ulcerative colitis as outlined above, returns reporting that he has relatively normal bowel movements but it takes 40 mg prednisone to maintain this.  He has been unable to get below 20 mg of prednisone he had gotten down there again and went to 15 mg and then things flared and he went back to 40 mg.  He is now on olmesartan with elevated blood pressure that has developed with  the use of steroids.  He has had his initial infusion of Stelara and 1 subcutaneous injection.  No ill toward effects from that that we know of. No Known Allergies Current Meds  Medication Sig   Avanafil (STENDRA) 200 MG TABS TAKE 1 TABLET BY MOUTH ONCE DAILY 15 TO 30 MINUTES BEFORE INTERCOURSE AS  DIRECTED   DULoxetine (CYMBALTA) 60 MG capsule TAKE 1 CAPSULE BY MOUTH EVERY DAY   fluticasone (FLONASE) 50 MCG/ACT nasal spray Place into both nostrils daily.   Loperamide HCl (IMODIUM PO) Take by mouth 2 (two) times daily.   Loratadine (CLARITIN PO) Take by mouth.   Multiple Vitamin (MULTIVITAMIN) capsule Take 1 capsule by mouth daily.   nebivolol (BYSTOLIC) 5 MG tablet Take 1 tablet (5 mg total) by mouth daily.   olmesartan (BENICAR) 20 MG tablet Take 1 tablet (20 mg total) by mouth daily.   omeprazole (PRILOSEC) 10 MG capsule Take 10 mg by mouth daily.   predniSONE (DELTASONE) 10 MG tablet Take 2 tablets (20 mg total) by mouth daily with breakfast.   traZODone (DESYREL) 50 MG tablet TAKE 1 TABLET BY MOUTH EVERY DAY AT BEDTIME AS NEEDED   ustekinumab (STELARA) 90 MG/ML SOSY injection Inject 1 mL (90 mg total) into the skin every 8 (eight) weeks.   VITAMIN D, CHOLECALCIFEROL, PO Take by mouth.   Past Medical History:  Diagnosis Date   Allergy    Anxiety    Central retinal vein occlusion of left eye 03/14/2022   Depression    GERD (gastroesophageal reflux disease)    Neuromuscular disorder (HCC)    acute bil lower legs neuropathy   Substance abuse ([place])    alcohol - previous   Universal ulcerative colitis ([place])    Past Surgical History:  Procedure Laterality Date   clavical - right repair     2010   HARDWARE REMOVAL  2011   [place]   Social History   Social History Narrative   Single, he is a patent and Photographer   Alcoholism in remission   Cigar smoker occasionally   No drug use   family history includes Breast cancer in his maternal grandmother; Colon cancer (age of onset: 94) in his maternal grandmother; Crohn's disease in his mother; Esophageal cancer in his maternal grandfather; Prostate cancer in his maternal uncle; Thyroid cancer in his mother.   Review of Systems As per HPI  Objective:   Physical Exam '@BP'$  126/70   Pulse 65   Ht '5\' 9"'$   (1.753 m)   Wt 176 lb (79.8 kg)   SpO2 98%   BMI 25.99 kg/m @  General:  NAD Eyes:   anicteric Lungs:  clear Heart::  S1S2 no rubs, murmurs or gallops Abdomen:  soft and nontender, BS+ Ext:   no edema, cyanosis or clubbing    Data Reviewed:  See HPI

## 2022-07-06 LAB — CBC WITH DIFFERENTIAL/PLATELET
Basophils Absolute: 0 10*3/uL (ref 0.0–0.1)
Basophils Relative: 0.1 % (ref 0.0–3.0)
Eosinophils Absolute: 0 10*3/uL (ref 0.0–0.7)
Eosinophils Relative: 0 % (ref 0.0–5.0)
HCT: 38.9 % — ABNORMAL LOW (ref 39.0–52.0)
Hemoglobin: 13 g/dL (ref 13.0–17.0)
Lymphocytes Relative: 5.7 % — ABNORMAL LOW (ref 12.0–46.0)
Lymphs Abs: 0.8 10*3/uL (ref 0.7–4.0)
MCHC: 33.4 g/dL (ref 30.0–36.0)
MCV: 89.1 fl (ref 78.0–100.0)
Monocytes Absolute: 0.6 10*3/uL (ref 0.1–1.0)
Monocytes Relative: 4.8 % (ref 3.0–12.0)
Neutro Abs: 12.2 10*3/uL — ABNORMAL HIGH (ref 1.4–7.7)
Neutrophils Relative %: 89.4 % — ABNORMAL HIGH (ref 43.0–77.0)
Platelets: 315 10*3/uL (ref 150.0–400.0)
RBC: 4.37 Mil/uL (ref 4.22–5.81)
RDW: 14.2 % (ref 11.5–15.5)
WBC: 13.6 10*3/uL — ABNORMAL HIGH (ref 4.0–10.5)

## 2022-07-06 LAB — SEDIMENTATION RATE: Sed Rate: 13 mm/hr (ref 0–15)

## 2022-07-19 ENCOUNTER — Ambulatory Visit (HOSPITAL_COMMUNITY): Payer: 59 | Attending: Internal Medicine

## 2022-07-19 DIAGNOSIS — I119 Hypertensive heart disease without heart failure: Secondary | ICD-10-CM | POA: Diagnosis not present

## 2022-07-19 DIAGNOSIS — R9431 Abnormal electrocardiogram [ECG] [EKG]: Secondary | ICD-10-CM | POA: Diagnosis not present

## 2022-07-20 ENCOUNTER — Other Ambulatory Visit: Payer: Self-pay | Admitting: Internal Medicine

## 2022-07-20 ENCOUNTER — Other Ambulatory Visit: Payer: Self-pay | Admitting: Gastroenterology

## 2022-07-20 ENCOUNTER — Ambulatory Visit (INDEPENDENT_AMBULATORY_CARE_PROVIDER_SITE_OTHER)
Admission: RE | Admit: 2022-07-20 | Discharge: 2022-07-20 | Disposition: A | Discharge status: Home / Self Care | Payer: 59 | Source: Ambulatory Visit | Attending: Internal Medicine | Admitting: Internal Medicine

## 2022-07-20 DIAGNOSIS — F411 Generalized anxiety disorder: Secondary | ICD-10-CM

## 2022-07-20 DIAGNOSIS — I119 Hypertensive heart disease without heart failure: Secondary | ICD-10-CM

## 2022-07-20 DIAGNOSIS — Z7960 Long term (current) use of unspecified immunomodulators and immunosuppressants: Secondary | ICD-10-CM

## 2022-07-20 LAB — ECHOCARDIOGRAM COMPLETE
Area-P 1/2: 2.88 cm2
S' Lateral: 3.4 cm

## 2022-07-20 MED ORDER — OLMESARTAN MEDOXOMIL 20 MG PO TABS: 20.0000 mg | ORAL_TABLET | Freq: Every day | ORAL | 0 refills | Status: DC

## 2022-07-20 MED ORDER — NEBIVOLOL HCL 5 MG PO TABS: 5.0000 mg | ORAL_TABLET | Freq: Every day | ORAL | 0 refills | Status: DC

## 2022-07-20 NOTE — Addendum Note (Signed)
Addended by: Janith Lima on: 07/20/2022 12:39 PM   Modules accepted: Orders

## 2022-07-20 NOTE — Telephone Encounter (Signed)
Please advise Sir, thank you. 

## 2022-07-21 ENCOUNTER — Encounter: Payer: Self-pay | Admitting: Internal Medicine

## 2022-08-01 ENCOUNTER — Encounter: Payer: Self-pay | Admitting: Internal Medicine

## 2022-08-01 ENCOUNTER — Ambulatory Visit (AMBULATORY_SURGERY_CENTER): Payer: 59 | Admitting: Internal Medicine

## 2022-08-01 VITALS — BP 102/60 | HR 61 | Temp 98.0°F | Resp 11 | Ht 69.0 in | Wt 176.0 lb

## 2022-08-01 DIAGNOSIS — K529 Noninfective gastroenteritis and colitis, unspecified: Secondary | ICD-10-CM | POA: Diagnosis not present

## 2022-08-01 DIAGNOSIS — D125 Benign neoplasm of sigmoid colon: Secondary | ICD-10-CM | POA: Diagnosis not present

## 2022-08-01 DIAGNOSIS — K51019 Ulcerative (chronic) pancolitis with unspecified complications: Secondary | ICD-10-CM

## 2022-08-01 DIAGNOSIS — Z86010 Personal history of colonic polyps: Secondary | ICD-10-CM

## 2022-08-01 DIAGNOSIS — D128 Benign neoplasm of rectum: Secondary | ICD-10-CM | POA: Diagnosis not present

## 2022-08-01 DIAGNOSIS — D124 Benign neoplasm of descending colon: Secondary | ICD-10-CM

## 2022-08-01 DIAGNOSIS — D120 Benign neoplasm of cecum: Secondary | ICD-10-CM

## 2022-08-01 DIAGNOSIS — K5130 Ulcerative (chronic) rectosigmoiditis without complications: Secondary | ICD-10-CM

## 2022-08-01 DIAGNOSIS — K635 Polyp of colon: Secondary | ICD-10-CM | POA: Diagnosis not present

## 2022-08-01 DIAGNOSIS — Z860101 Personal history of adenomatous and serrated colon polyps: Secondary | ICD-10-CM | POA: Insufficient documentation

## 2022-08-01 HISTORY — DX: Personal history of adenomatous and serrated colon polyps: Z86.0101

## 2022-08-01 MED ORDER — SODIUM CHLORIDE 0.9 % IV SOLN: 500.0000 mL | Freq: Once | INTRAVENOUS | Status: DC

## 2022-08-01 NOTE — Progress Notes (Signed)
Called to room to assist during endoscopic procedure.  Patient ID and intended procedure confirmed with present staff. Received instructions for my participation in the procedure from the performing physician.  

## 2022-08-01 NOTE — Progress Notes (Signed)
To pacu, VSS. Report to [email or web address] 

## 2022-08-01 NOTE — Op Note (Signed)
[facility] Patient Name: Blake Chapman Procedure Date: 08/01/2022 10:35 AM MRN: [id number] Endoscopist: Iva Boop , MD, [id number] Age: 37 Referring MD:  Date of Birth: January 13, 1986 Gender: Male Account #: [id number] Procedure:                Colonoscopy Indications:              Chronic ulcerative pancolitis, Follow-up of chronic                            ulcerative pancolitis, Disease activity assessment                            of chronic ulcerative pancolitis, Assess                            therapeutic response to therapy of chronic                            ulcerative pancolitis. Distal UC; presented 2017                            with intermittent rectal bleeding. Colonoscopy                            December 2017 Dr. Christella Hartigan found moderate                            inflammation from rectum up to 25 cm. The rest of                            the colon was normal. Terminal ileum could not be                            intubated. Biopsies from the inflamed segment                            showed chronic active inflammation. Biopsies from                            non-inflamed appearing segments of colon in the                            right and left segments were all normal. He was                            started on mesalamine enema and mesalamine orally.                            September 2018 flare, labs showed white blood cell                            count was 20k and I Put him on vancomycin, C.  difficile by PCR was negative however. Eventual                            steroid pulse 40 mg once daily significantly helped                            him clinically. Aug 2019 flaring again, sed rate                            54, required steroids again. CT scan 11/2017                            confirmed inflammation distal descending and                            sigmoid colon. Stool testing neg for infection.                             New start entyvio 11/2017.                            Labs August 2019: HIV negative, hepatitis B                            negative, hepatitis C negative. TB quant gold                            indeterminant followed up by normal chest x-ray,                            followed up by negative PPD.                            01/2019: entyvio Antibodies negative; entyvio                            trough drug leve 5.7; increased dosing to every 6                            weeks instead of every 8                           Colonoscopy Christella Hartigan 07/20/2021-universal UC severe                            rectum to 30 cm mild throughout otherwise biopsies                            confirm and no signs of viral infection                           Humira initiated late March early April 2023 40 mg  every other week                           Stelara initiated April 27, 2022 Medicines:                Monitored Anesthesia Care Procedure:                After obtaining informed consent, the colonoscope                            was passed under direct vision. Throughout the                            procedure, the patient's blood pressure, pulse, and                            oxygen saturations were monitored continuously. The                            Olympus CF-[id number]) Colonoscope was                            introduced through the anus and advanced to the the                            terminal ileum, with identification of the                            appendiceal orifice and IC valve. Scope In: 10:56:04 AM Scope Out: 11:23:49 AM Scope Withdrawal Time: 0 hours 24 minutes 30 seconds  Total Procedure Duration: 0 hours 27 minutes 45 seconds  Findings:                 The perianal and digital rectal examinations were                            normal.                           Inflammation characterized by altered vascularity,                             congestion (edema), erythema and friability was                            found in a continuous and circumferential pattern                            from the rectum to the sigmoid colon. The                            inflammation was mild in severity, and when                            compared to previous examinations, the findings are  improved. Biopsies were taken with a cold forceps                            for histology. Verification of patient                            identification for the specimen was done. Estimated                            blood loss was minimal.                           Six sessile and semi-pedunculated polyps were found                            in the rectum and sigmoid colon. The polyps were 3                            to 15 mm in size. These polyps were removed with a                            hot snare. Resection and retrieval were complete.                            Verification of patient identification for the                            specimen was done. Estimated blood loss was                            minimal. To prevent bleeding after the polypectomy,                            one hemostatic clip was successfully placed. There                            was no bleeding at the end of the procedure.                            Estimated blood loss: none.                           Two sessile polyps were found in the descending                            colon and cecum. The polyps were diminutive in                            size. These polyps were removed with a cold snare.                            Resection and retrieval were complete. Verification  of patient identification for the specimen was done.                           The terminal ileum appeared normal.                           The descending colon, transverse colon, ascending                             colon and cecum appeared normal. Biopsies were                            taken with a cold forceps for histology.                            Verification of patient identification for the                            specimen was done. Estimated blood loss was minimal. Complications:            No immediate complications. Estimated Blood Loss:     Estimated blood loss was minimal. Impression:               - Pancolitis ulcerative colitis. Inflammation was                            found from the rectum to the sigmoid colon. This                            was mild in severity, improved compared to previous                            examinations. Biopsied.                           - Six 3 to 15 mm polyps in the rectum and in the                            sigmoid colon, removed with a hot snare. Resected                            and retrieved. Clip was placed on sigmoid site to                            prevent significant bleeding (self-limited ooze                            seen).                           - Two diminutive polyps in the descending colon and                            in the cecum, removed with a cold snare. Resected  and retrieved.                           - The examined portion of the ileum was normal.                           - The descending colon, transverse colon, ascending                            colon and cecum are normal. Biopsied.                           This appears improved vs 2023 exam. Suspect polyps                            in rectum and sigmoid are inflammatory. Recommendation:           - Patient has a contact number available for                            emergencies. The signs and symptoms of potential                            delayed complications were discussed with the                            patient. Return to normal activities tomorrow.                            Written discharge instructions were  provided to the                            patient.                           - Resume previous diet.                           - Continue present medications.                           - Await pathology results.                           - No aspirin, ibuprofen, naproxen, or other                            non-steroidal anti-inflammatory drugs for 2 weeks                            after polyp removal and because of UC long-term                           - Reduce prednisone to 30 mg daily Iva Boop, MD 08/01/2022 11:38:11 AM This report has been signed electronically.

## 2022-08-01 NOTE — Progress Notes (Signed)
History and Physical Interval Note:  08/01/2022 10:45 AM  Blake Chapman  has presented today for endoscopic procedure(s), with the diagnosis of  Encounter Diagnoses  Name Primary?   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  The various methods of evaluation and treatment have been discussed with the patient and/or family. After consideration of risks, benefits and other options for treatment, the patient has consented to  the endoscopic procedure(s).   The patient's history has been reviewed, patient examined, no change in status, stable for endoscopic procedure(s).  I have reviewed the patient's chart and labs.  Questions were answered to the patient's satisfaction.     Iva Boop, MD, [facility]

## 2022-08-01 NOTE — Patient Instructions (Addendum)
See report for details - several polyps removed - inflammation impoved vs 2023.  Reduce prednisone to 30 mg daily.  I appreciate the opportunity to care for you. Iva Boop, MD, [facility]  Handouts on polyps given. Clip card given.  Recommendation: Patient has a contact number available for                            emergencies. The signs and symptoms of potential                            delayed complications were discussed with the                            patient. Return to normal activities tomorrow.                            Written discharge instructions were provided to the                            patient.                           - Resume previous diet.                           - Continue present medications.                           - Await pathology results.                           - No aspirin, ibuprofen, naproxen, or other                            non-steroidal anti-inflammatory drugs for 2 weeks                            after polyp removal and because of UC long-term                           - Reduce prednisone to 30 mg daily   YOU HAD AN ENDOSCOPIC PROCEDURE TODAY AT THE [facility]:   Refer to the procedure report that was given to you for any specific questions about what was found during the examination.  If the procedure report does not answer your questions, please call your gastroenterologist to clarify.  If you requested that your care partner not be given the details of your procedure findings, then the procedure report has been included in a sealed envelope for you to review at your convenience later.  YOU SHOULD EXPECT: Some feelings of bloating in the abdomen. Passage of more gas than usual.  Walking can help get rid of the air that was put into your GI tract during the procedure and reduce the bloating. If you had a lower endoscopy (such as a colonoscopy or flexible sigmoidoscopy) you may notice spotting of blood in your stool or on  the toilet paper. If you underwent a bowel prep for  your procedure, you may not have a normal bowel movement for a few days.  Please Note:  You might notice some irritation and congestion in your nose or some drainage.  This is from the oxygen used during your procedure.  There is no need for concern and it should clear up in a day or so.  SYMPTOMS TO REPORT IMMEDIATELY:  Following lower endoscopy (colonoscopy or flexible sigmoidoscopy):  Excessive amounts of blood in the stool  Significant tenderness or worsening of abdominal pains  Swelling of the abdomen that is new, acute  Fever of 100F or higher  For urgent or emergent issues, a gastroenterologist can be reached at any hour by calling [phone]. Do not use MyChart messaging for urgent concerns.    DIET:  We do recommend a small meal at first, but then you may proceed to your regular diet.  Drink plenty of fluids but you should avoid alcoholic beverages for 24 hours.  ACTIVITY:  You should plan to take it easy for the rest of today and you should NOT DRIVE or use heavy machinery until tomorrow (because of the sedation medicines used during the test).    FOLLOW UP: Our staff will call the number listed on your records the next business day following your procedure.  We will call around 7:15- 8:00 am to check on you and address any questions or concerns that you may have regarding the information given to you following your procedure. If we do not reach you, we will leave a message.     If any biopsies were taken you will be contacted by phone or by letter within the next 1-3 weeks.  Please call us at [phone] if you have not heard about the biopsies in 3 weeks.    SIGNATURES/CONFIDENTIALITY: You and/or your care partner have signed paperwork which will be entered into your electronic medical record.  These signatures attest to the fact that that the information above on your After Visit Summary has been reviewed and is  understood.  Full responsibility of the confidentiality of this discharge information lies with you and/or your care-partner.

## 2022-08-02 ENCOUNTER — Telehealth: Payer: Self-pay | Admitting: *Deleted

## 2022-08-02 NOTE — Telephone Encounter (Signed)
  Follow up Call-     08/01/2022    9:57 AM 07/20/2021    8:48 AM  Call back number  Post procedure Call Back phone  # [phone]  Permission to leave phone message Yes Yes     Patient questions:   Message left to call if necessary.

## 2022-08-05 ENCOUNTER — Other Ambulatory Visit: Payer: Self-pay | Admitting: Internal Medicine

## 2022-08-05 DIAGNOSIS — F411 Generalized anxiety disorder: Secondary | ICD-10-CM

## 2022-08-15 ENCOUNTER — Other Ambulatory Visit: Payer: Self-pay | Admitting: Internal Medicine

## 2022-08-15 ENCOUNTER — Encounter: Payer: Self-pay | Admitting: Internal Medicine

## 2022-08-15 ENCOUNTER — Ambulatory Visit (INDEPENDENT_AMBULATORY_CARE_PROVIDER_SITE_OTHER): Payer: 59 | Admitting: Internal Medicine

## 2022-08-15 VITALS — BP 136/82 | HR 71 | Temp 98.3°F | Resp 16 | Ht 69.0 in | Wt 176.0 lb

## 2022-08-15 DIAGNOSIS — I119 Hypertensive heart disease without heart failure: Secondary | ICD-10-CM | POA: Diagnosis not present

## 2022-08-15 DIAGNOSIS — I10 Essential (primary) hypertension: Secondary | ICD-10-CM

## 2022-08-15 DIAGNOSIS — Z114 Encounter for screening for human immunodeficiency virus [HIV]: Secondary | ICD-10-CM

## 2022-08-15 DIAGNOSIS — D539 Nutritional anemia, unspecified: Secondary | ICD-10-CM | POA: Insufficient documentation

## 2022-08-15 LAB — FOLATE: Folate: 11.9 ng/mL (ref >5.9)

## 2022-08-15 LAB — CBC WITH DIFFERENTIAL/PLATELET
Basophils Absolute: 0 10*3/uL (ref 0.0–0.1)
Basophils Relative: 0.2 % (ref 0.0–3.0)
Eosinophils Absolute: 0 10*3/uL (ref 0.0–0.7)
Eosinophils Relative: 0 % (ref 0.0–5.0)
HCT: 40.3 % (ref 39.0–52.0)
Hemoglobin: 13.8 g/dL (ref 13.0–17.0)
Lymphocytes Relative: 8.2 % — ABNORMAL LOW (ref 12.0–46.0)
Lymphs Abs: 0.8 10*3/uL (ref 0.7–4.0)
MCHC: 34.2 g/dL (ref 30.0–36.0)
MCV: 91.4 fl (ref 78.0–100.0)
Monocytes Absolute: 0.3 10*3/uL (ref 0.1–1.0)
Monocytes Relative: 3.2 % (ref 3.0–12.0)
Neutro Abs: 9 10*3/uL — ABNORMAL HIGH (ref 1.4–7.7)
Neutrophils Relative %: 88.4 % — ABNORMAL HIGH (ref 43.0–77.0)
Platelets: 308 10*3/uL (ref 150.0–400.0)
RBC: 4.41 Mil/uL (ref 4.22–5.81)
RDW: 13.3 % (ref 11.5–15.5)
WBC: 10.2 10*3/uL (ref 4.0–10.5)

## 2022-08-15 LAB — BASIC METABOLIC PANEL
BUN: 17 mg/dL (ref 6–23)
CO2: 27 mEq/L (ref 19–32)
Calcium: 9.9 mg/dL (ref 8.4–10.5)
Chloride: 100 mEq/L (ref 96–112)
Creatinine, Ser: 1.22 mg/dL (ref 0.40–1.50)
GFR: 74.96 mL/min (ref >60.00)
Glucose, Bld: 99 mg/dL (ref 70–99)
Potassium: 4.2 mEq/L (ref 3.5–5.1)
Sodium: 138 mEq/L (ref 135–145)

## 2022-08-15 LAB — IBC + FERRITIN
Ferritin: 12.2 ng/mL — ABNORMAL LOW (ref 22.0–322.0)
Iron: 85 ug/dL (ref 42–165)
Saturation Ratios: 22.5 % (ref 20.0–50.0)
TIBC: 378 ug/dL (ref 250.0–450.0)
Transferrin: 270 mg/dL (ref 212.0–360.0)

## 2022-08-15 LAB — VITAMIN B12: Vitamin B-12: 304 pg/mL (ref 211–911)

## 2022-08-15 MED ORDER — NEBIVOLOL HCL 5 MG PO TABS: 5.0000 mg | ORAL_TABLET | Freq: Every day | ORAL | 1 refills | Status: DC

## 2022-08-15 MED ORDER — OLMESARTAN MEDOXOMIL 20 MG PO TABS: 20.0000 mg | ORAL_TABLET | Freq: Every day | ORAL | 1 refills | Status: DC

## 2022-08-15 NOTE — Progress Notes (Signed)
Subjective:  Patient ID: Blake Chapman, male    DOB: Apr 22, 1986  Age: 37 y.o. 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  He denies chest pain, shortness of breath, diaphoresis, dizziness, lightheadedness, or edema.  Outpatient Medications Prior to Visit  Medication Sig Dispense Refill   Avanafil (STENDRA) 200 MG TABS TAKE 1 TABLET BY MOUTH ONCE DAILY 15 TO 30 MINUTES BEFORE INTERCOURSE AS DIRECTED 6 tablet 10   calcium carbonate (OS-CAL - DOSED IN MG OF ELEMENTAL CALCIUM) 1250 (500 Ca) MG tablet Take 1 tablet by mouth daily.     DULoxetine (CYMBALTA) 60 MG capsule TAKE 1 CAPSULE BY MOUTH EVERY DAY 90 capsule 1   fluticasone (FLONASE) 50 MCG/ACT nasal spray Place into both nostrils daily.     Loratadine (CLARITIN PO) Take by mouth.     Multiple Vitamin (MULTIVITAMIN) capsule Take 1 capsule by mouth daily.     omeprazole (PRILOSEC) 10 MG capsule Take 10 mg by mouth daily.     predniSONE (DELTASONE) 10 MG tablet TAKE 2 TABLETS BY MOUTH DAILY WITH BREAKFAST. 90 tablet 1   traZODone (DESYREL) 50 MG tablet TAKE 1 TABLET BY MOUTH EVERY DAY AT BEDTIME AS NEEDED 90 tablet 1   ustekinumab (STELARA) 90 MG/ML SOSY injection Inject 1 mL (90 mg total) into the skin every 8 (eight) weeks. 1 mL 5   VITAMIN D, CHOLECALCIFEROL, PO Take by mouth.     Loperamide HCl (IMODIUM PO) Take by mouth 2 (two) times daily.     nebivolol (BYSTOLIC) 5 MG tablet Take 1 tablet (5 mg total) by mouth daily. 90 tablet 0   olmesartan (BENICAR) 20 MG tablet Take 1 tablet (20 mg total) by mouth daily. 90 tablet 0   No facility-administered medications prior to visit.    ROS Review of Systems  Constitutional:  Negative for appetite change, diaphoresis, fatigue and unexpected weight change.  HENT: Negative.    Eyes: Negative.   Respiratory:  Negative for cough, chest tightness, shortness of breath and wheezing.   Cardiovascular:  Negative  for chest pain, palpitations and leg swelling.  Gastrointestinal:  Negative for abdominal pain, blood in stool, constipation, diarrhea, nausea and vomiting.  Genitourinary: Negative.   Musculoskeletal: Negative.  Negative for back pain, myalgias and neck pain.  Neurological: Negative.  Negative for dizziness and weakness.  Hematological:  Negative for adenopathy. Does not bruise/bleed easily.  Psychiatric/Behavioral: Negative.      Objective:  BP 136/82 (BP Location: Left Arm, Patient Position: Sitting, Cuff Size: Large)   Pulse 71   Temp 98.3 F (36.8 C) (Oral)   Resp 16   Ht 5\' 9"  (1.753 m)   Wt 176 lb (79.8 kg)   SpO2 99%   BMI 25.99 kg/m   BP Readings from Last 3 Encounters:  08/15/22 136/82  08/01/22 102/60  07/05/22 126/70    Wt Readings from Last 3 Encounters:  08/15/22 176 lb (79.8 kg)  08/01/22 176 lb (79.8 kg)  07/05/22 176 lb (79.8 kg)    Physical Exam Vitals reviewed.  Constitutional:      Appearance: He is not ill-appearing.  HENT:     Nose: Nose normal.     Mouth/Throat:     Mouth: Mucous membranes are moist.  Eyes:     General: No scleral icterus.    Conjunctiva/sclera: Conjunctivae normal.  Cardiovascular:     Rate and Rhythm: Normal  rate and regular rhythm.     Heart sounds: No murmur heard. Pulmonary:     Effort: Pulmonary effort is normal.     Breath sounds: No stridor. No wheezing, rhonchi or rales.  Abdominal:     General: Abdomen is flat.     Palpations: There is no mass.     Tenderness: There is no abdominal tenderness. There is no guarding.     Hernia: No hernia is present.  Musculoskeletal:        General: Normal range of motion.     Cervical back: Neck supple.     Right lower leg: No edema.     Left lower leg: No edema.  Lymphadenopathy:     Cervical: No cervical adenopathy.  Skin:    General: Skin is warm and dry.  Neurological:     General: No focal deficit present.     Mental Status: He is alert. Mental status is at  baseline.  Psychiatric:        Mood and Affect: Mood normal.        Behavior: Behavior normal.     Lab Results  Component Value Date   WBC 10.2 08/15/2022   HGB 13.8 08/15/2022   HCT 40.3 08/15/2022   PLT 308.0 08/15/2022   GLUCOSE 99 08/15/2022   CHOL 164 05/13/2020   TRIG 216.0 (H) 05/13/2020   HDL 51.30 05/13/2020   LDLDIRECT 101.0 05/13/2020   LDLCALC 105 (H) 11/29/2017   ALT 18 07/05/2022   AST 18 07/05/2022   NA 138 08/15/2022   K 4.2 08/15/2022   CL 100 08/15/2022   CREATININE 1.22 08/15/2022   BUN 17 08/15/2022   CO2 27 08/15/2022   TSH 0.64 05/16/2022    DG BONE DENSITY (DXA)  Result Date: 07/21/2022 Table formatting from the original result was not included. Date of study: 07/20/2022 Exam: DUAL X-RAY ABSORPTIOMETRY (DXA) FOR BONE MINERAL DENSITY (BMD) Instrument: [employer] Requesting Provider: PCP Indication: follow up for low BMD Comparison: none (please note that it is not possible to compare data from different instruments) Clinical data: Pt is a 37 y.o. male without previous history of fracture. Results:  Lumbar spine L1-L4(L3) Femoral neck (FN) Z-score -3.3 RFN: -1.2 LFN: -1.4 Assessment: By the [facility] Criteria for diagnosis based on bone density, this patient has Low Bone Density  ISCD recommends using Z-scores for assessment of pre-menopausal women, men between 59 and 16 yoa, and children under 69 yo. The diagnosis of osteoporosis in these patients should not be made on the basis of densitometric criteria alone.  Comments: the technical quality of the study is good. L3 vertebra had to be excluded from analysis due to DJD WHO criteria for diagnosis of osteoporosis in postmenopausal women and in men 29 y/o or older: - normal: T-score -1.0 to + 1.0 - osteopenia/low bone density: T-score between -2.5 and -1.0 - osteoporosis: T-score below -2.5 - severe osteoporosis: T-score below -2.5 with history of fragility fracture Note: although not part of the WHO  classification, the presence of a fragility fracture, regardless of the T-score, should be considered diagnostic of osteoporosis, provided other causes for the fracture have been excluded. RECOMMENDATION: 1. All patients should optimize calcium and vitamin D intake. 2. Consider FDA-approved medical therapies in postmenopausal women and men aged 51 years and older, based on the following: a. A hip or vertebral(clinical or morphometric) fracture. b. T-Score of  -2.5 or less at the femoral neck , total hip or spine after appropriate  evaluation to exclude secondary causes c. Low bone mass (T-score between -1.0 and -2.5 at the femoral neck or spine) and a 10 year probability of a hip fracture >3% or a 10 year probability of major osteoporosis-related fracture > 20% based on the [place]-adapted WHO algorithm d. Clinical judgement and/or patient preferences may indicate treatment for people with 10-year fracture probabilities above or below these levels Follow up BMD is recommended: 2 years. Interpreted by : Lyndle Herrlich, MD [facility]    Assessment & Plan:    Deficiency anemia- H&H are normal now.  Will screen for vitamin deficiencies. -     IBC + Ferritin; Future -     Vitamin B1; Future -     Zinc; Future -     Reticulocytes; Future -     CBC with Differential/Platelet; Future -     Vitamin B12; Future -     Folate; Future  Encounter for screening for HIV -     HIV Antibody (routine testing w rflx); Future  LVH (left ventricular hypertrophy) due to hypertensive disease, without heart failure- Echocardiogram was reassuring. -     Basic metabolic panel; Future -     Nebivolol HCl; Take 1 tablet (5 mg total) by mouth daily.  Dispense: 90 tablet; Refill: 1 -     Olmesartan Medoxomil; Take 1 tablet (20 mg total) by mouth daily.  Dispense: 90 tablet; Refill: 1  Primary hypertension- His blood pressure is adequately well-controlled. -     CBC with Differential/Platelet; Future -      Basic metabolic panel; Future -     Nebivolol HCl; Take 1 tablet (5 mg total) by mouth daily.  Dispense: 90 tablet; Refill: 1 -     Olmesartan Medoxomil; Take 1 tablet (20 mg total) by mouth daily.  Dispense: 90 tablet; Refill: 1     Follow-up: Return in about 6 months (around 02/14/2023).  Sanda Linger, MD

## 2022-08-15 NOTE — Patient Instructions (Signed)
Hypertension, Adult High blood pressure (hypertension) is when the force of blood pumping through the arteries is too strong. The arteries are the blood vessels that carry blood from the heart throughout the body. Hypertension forces the heart to work harder to pump blood and may cause arteries to become narrow or stiff. Untreated or uncontrolled hypertension can lead to a heart attack, heart failure, a stroke, kidney disease, and other problems. A blood pressure reading consists of a higher number over a lower number. Ideally, your blood pressure should be below 120/80. The first ("top") number is called the systolic pressure. It is a measure of the pressure in your arteries as your heart beats. The second ("bottom") number is called the diastolic pressure. It is a measure of the pressure in your arteries as the heart relaxes. What are the causes? The exact cause of this condition is not known. There are some conditions that result in high blood pressure. What increases the risk? Certain factors may make you more likely to develop high blood pressure. Some of these risk factors are under your control, including: Smoking. Not getting enough exercise or physical activity. Being overweight. Having too much fat, sugar, calories, or salt (sodium) in your diet. Drinking too much alcohol. Other risk factors include: Having a personal history of heart disease, diabetes, high cholesterol, or kidney disease. Stress. Having a family history of high blood pressure and high cholesterol. Having obstructive sleep apnea. Age. The risk increases with age. What are the signs or symptoms? High blood pressure may not cause symptoms. Very high blood pressure (hypertensive crisis) may cause: Headache. Fast or irregular heartbeats (palpitations). Shortness of breath. Nosebleed. Nausea and vomiting. Vision changes. Severe chest pain, dizziness, and seizures. How is this diagnosed? This condition is diagnosed by  measuring your blood pressure while you are seated, with your arm resting on a flat surface, your legs uncrossed, and your feet flat on the floor. The cuff of the blood pressure monitor will be placed directly against the skin of your upper arm at the level of your heart. Blood pressure should be measured at least twice using the same arm. Certain conditions can cause a difference in blood pressure between your right and left arms. If you have a high blood pressure reading during one visit or you have normal blood pressure with other risk factors, you may be asked to: Return on a different day to have your blood pressure checked again. Monitor your blood pressure at home for 1 week or longer. If you are diagnosed with hypertension, you may have other blood or imaging tests to help your health care provider understand your overall risk for other conditions. How is this treated? This condition is treated by making healthy lifestyle changes, such as eating healthy foods, exercising more, and reducing your alcohol intake. You may be referred for counseling on a healthy diet and physical activity. Your health care provider may prescribe medicine if lifestyle changes are not enough to get your blood pressure under control and if: Your systolic blood pressure is above 130. Your diastolic blood pressure is above 80. Your personal target blood pressure may vary depending on your medical conditions, your age, and other factors. Follow these instructions at home: Eating and drinking  Eat a diet that is high in fiber and potassium, and low in sodium, added sugar, and fat. An example of this eating plan is called the DASH diet. DASH stands for Dietary Approaches to Stop Hypertension. To eat this way: Eat   plenty of fresh fruits and vegetables. Try to fill one half of your plate at each meal with fruits and vegetables. Eat whole grains, such as whole-wheat pasta, brown rice, or whole-grain bread. Fill about one  fourth of your plate with whole grains. Eat or drink low-fat dairy products, such as skim milk or low-fat yogurt. Avoid fatty cuts of meat, processed or cured meats, and poultry with skin. Fill about one fourth of your plate with lean proteins, such as fish, chicken without skin, beans, eggs, or tofu. Avoid pre-made and processed foods. These tend to be higher in sodium, added sugar, and fat. Reduce your daily sodium intake. Many people with hypertension should eat less than 1,500 mg of sodium a day. Do not drink alcohol if: Your health care provider tells you not to drink. You are pregnant, may be pregnant, or are planning to become pregnant. If you drink alcohol: Limit how much you have to: 0-1 drink a day for women. 0-2 drinks a day for men. Know how much alcohol is in your drink. In the [place] one drink equals one 12 oz bottle of beer (355 mL), one 5 oz glass of wine (148 mL), or one 1 oz glass of hard liquor (44 mL). Lifestyle  Work with your health care provider to maintain a healthy body weight or to lose weight. Ask what an ideal weight is for you. Get at least 30 minutes of exercise that causes your heart to beat faster (aerobic exercise) most days of the week. Activities may include walking, swimming, or biking. Include exercise to strengthen your muscles (resistance exercise), such as Pilates or lifting weights, as part of your weekly exercise routine. Try to do these types of exercises for 30 minutes at least 3 days a week. Do not use any products that contain nicotine or tobacco. These products include cigarettes, chewing tobacco, and vaping devices, such as e-cigarettes. If you need help quitting, ask your health care provider. Monitor your blood pressure at home as told by your health care provider. Keep all follow-up visits. This is important. Medicines Take over-the-counter and prescription medicines only as told by your health care provider. Follow directions carefully. Blood  pressure medicines must be taken as prescribed. Do not skip doses of blood pressure medicine. Doing this puts you at risk for problems and can make the medicine less effective. Ask your health care provider about side effects or reactions to medicines that you should watch for. Contact a health care provider if you: Think you are having a reaction to a medicine you are taking. Have headaches that keep coming back (recurring). Feel dizzy. Have swelling in your ankles. Have trouble with your vision. Get help right away if you: Develop a severe headache or confusion. Have unusual weakness or numbness. Feel faint. Have severe pain in your chest or abdomen. Vomit repeatedly. Have trouble breathing. These symptoms may be an emergency. Get help right away. Call 911. Do not wait to see if the symptoms will go away. Do not drive yourself to the hospital. Summary Hypertension is when the force of blood pumping through your arteries is too strong. If this condition is not controlled, it may put you at risk for serious complications. Your personal target blood pressure may vary depending on your medical conditions, your age, and other factors. For most people, a normal blood pressure is less than 120/80. Hypertension is treated with lifestyle changes, medicines, or a combination of both. Lifestyle changes include losing weight, eating a healthy,   low-sodium diet, exercising more, and limiting alcohol. This information is not intended to replace advice given to you by your health care provider. Make sure you discuss any questions you have with your health care provider. Document Revised: 02/16/2021 Document Reviewed: 02/16/2021 Elsevier Patient Education  2023 Elsevier Inc.  

## 2022-08-16 ENCOUNTER — Encounter: Payer: Self-pay | Admitting: Internal Medicine

## 2022-08-19 ENCOUNTER — Encounter: Payer: Self-pay | Admitting: Internal Medicine

## 2022-08-19 LAB — ZINC: Zinc: 64 ug/dL (ref 60–130)

## 2022-08-19 LAB — HIV ANTIBODY (ROUTINE TESTING W REFLEX): HIV 1&2 Ab, 4th Generation: NONREACTIVE

## 2022-08-19 LAB — VITAMIN B1: Vitamin B1 (Thiamine): 17 nmol/L (ref 8–30)

## 2022-08-19 LAB — RETICULOCYTES
ABS Retic: 66750 cells/uL (ref 25000–90000)
Retic Ct Pct: 1.5 %

## 2022-09-24 ENCOUNTER — Other Ambulatory Visit: Payer: Self-pay | Admitting: Internal Medicine

## 2022-09-24 DIAGNOSIS — F411 Generalized anxiety disorder: Secondary | ICD-10-CM

## 2022-09-25 ENCOUNTER — Other Ambulatory Visit: Payer: Self-pay | Admitting: Internal Medicine

## 2022-09-25 DIAGNOSIS — F411 Generalized anxiety disorder: Secondary | ICD-10-CM

## 2022-10-06 ENCOUNTER — Encounter: Payer: Self-pay | Admitting: Internal Medicine

## 2022-10-12 ENCOUNTER — Ambulatory Visit (INDEPENDENT_AMBULATORY_CARE_PROVIDER_SITE_OTHER): Payer: 59 | Admitting: Internal Medicine

## 2022-10-12 ENCOUNTER — Encounter: Payer: Self-pay | Admitting: Internal Medicine

## 2022-10-12 VITALS — BP 112/70 | HR 57 | Temp 98.3°F | Ht 69.0 in | Wt 177.0 lb

## 2022-10-12 DIAGNOSIS — I119 Hypertensive heart disease without heart failure: Secondary | ICD-10-CM

## 2022-10-12 DIAGNOSIS — R001 Bradycardia, unspecified: Secondary | ICD-10-CM | POA: Insufficient documentation

## 2022-10-12 NOTE — Patient Instructions (Signed)
Bradycardia, Adult Bradycardia is a slower-than-normal heartbeat. A normal resting heart rate for an adult ranges from 60 to 100 beats per minute. With bradycardia, the resting heart rate is less than 60 beats per minute. Bradycardia can prevent enough oxygen from reaching certain areas of your body when you are active. It can be serious if it keeps enough oxygen from reaching your brain and other parts of your body. Bradycardia is not a problem for everyone. For some healthy adults, a slow resting heart rate is normal. What are the causes? This condition may be caused by: A problem with the heart, including: A problem with the heart's electrical system, such as a heart block. With a heart block, electrical signals between the chambers of the heart are partially or completely blocked, so they are not able to work as they should. A problem with the heart's natural pacemaker (sinus node). Heart disease. A heart attack. Heart damage. Lyme disease. A heart infection. A heart condition that is present at birth (congenital heart defect). Certain medicines that treat heart conditions. Certain conditions, such as hypothyroidism and obstructive sleep apnea. Problems with the balance of chemicals and other substances, like potassium, in the blood. Trauma. Radiation therapy. What increases the risk? You are more likely to develop this condition if you: Are age 65 or older. Have high blood pressure (hypertension), high cholesterol (hyperlipidemia), or diabetes. Drink heavily, use tobacco or nicotine products, or use drugs. What are the signs or symptoms? Symptoms of this condition include: Light-headedness. Feeling faint or fainting. Fatigue and weakness. Trouble with activity or exercise. Shortness of breath. Chest pain (angina). Drowsiness. Confusion. Dizziness. How is this diagnosed? This condition may be diagnosed based on: Your symptoms. Your medical history. A physical exam. During  the exam, your health care provider will listen to your heartbeat and check your pulse. To confirm the diagnosis, your health care provider may order tests, such as: Blood tests. An electrocardiogram (ECG). This test records the heart's electrical activity. The test can show how fast your heart is beating and whether the heartbeat is steady. A test in which you wear a portable device (event recorder or Holter monitor) to record your heart's electrical activity while you go about your day. An exercise test. How is this treated? Treatment for this condition depends on the cause of the condition and how severe your symptoms are. Treatment may involve: Treatment of the underlying condition. Changing your medicines or how much medicine you take. Having a small, battery-operated device called a pacemaker implanted under the skin. When bradycardia occurs, this device can be used to increase your heart rate and help your heart beat in a regular rhythm. Follow these instructions at home: Lifestyle Manage any health conditions that contribute to bradycardia as told by your health care provider. Follow a heart-healthy diet. A nutrition specialist (dietitian) can help educate you about healthy food options and changes. Follow an exercise program that is approved by your health care provider. Maintain a healthy weight. Try to reduce or manage your stress, such as with yoga or meditation. If you need help reducing stress, ask your health care provider. Do not use any products that contain nicotine or tobacco. These products include cigarettes, chewing tobacco, and vaping devices, such as e-cigarettes. If you need help quitting, ask your health care provider. Do not use illegal drugs. Alcohol use If you drink alcohol: Limit how much you have to: 0-1 drink a day for women who are not pregnant. 0-2 drinks a day   for men. Know how much alcohol is in a drink. In the [place] one drink equals one 12 oz bottle of  beer (355 mL), one 5 oz glass of wine (148 mL), or one 1 oz glass of hard liquor (44 mL). General instructions Take over-the-counter and prescription medicines only as told by your health care provider. Keep all follow-up visits. This is important. How is this prevented? In some cases, bradycardia may be prevented by: Treating underlying medical problems. Stopping behaviors or medicines that can trigger the condition. Contact a health care provider if: You feel light-headed or dizzy. You almost faint. You feel weak or are easily fatigued during physical activity. You experience confusion or have memory problems. Get help right away if: You faint. You have chest pains or an irregular heartbeat (palpitations). You have trouble breathing. These symptoms may represent a serious problem that is an emergency. Do not wait to see if the symptoms will go away. Get medical help right away. Call your local emergency services (911 in the [place] Do not drive yourself to the hospital. Summary Bradycardia is a slower-than-normal heartbeat. With bradycardia, the resting heart rate is less than 60 beats per minute. Treatment for this condition depends on the cause. Manage any health conditions that contribute to bradycardia as told by your health care provider. Do not use any products that contain nicotine or tobacco. These products include cigarettes, chewing tobacco, and vaping devices, such as e-cigarettes. Keep all follow-up visits. This is important. This information is not intended to replace advice given to you by your health care provider. Make sure you discuss any questions you have with your health care provider. Document Revised: 08/02/2020 Document Reviewed: 08/02/2020 Elsevier Patient Education  2024 Elsevier Inc.  

## 2022-10-12 NOTE — Progress Notes (Signed)
Subjective:  Patient ID: Blake Chapman, male    DOB: [birth year]  Age: 37 y.o. MRN: [id number]  CC: Hypertension   HPI Burman Madrazo presents for f/up ---  Discussed the use of AI scribe software for clinical note transcription with the patient, who gave verbal consent to proceed.  History of Present Illness   The patient presents with concerns about their current blood pressure medication regimen, expressing a desire to begin tapering off these medications. They have been experiencing symptoms of dizziness, lightheadedness, and fatigue, which they attribute to their current medication regimen. They also report a decrease in heart rate during cardio exercise, which they believe is due to their blood pressure medication.  Over the past year and a half, the patient has been on a slow taper of prednisone, reducing from a daily dose of 40-60mg  to their current dose of 2.5mg . They attempted to stop the prednisone completely but found this to be too much of a jump, leading to a return to the 2.5mg  dose.  The patient has been monitoring their blood pressure at home and has noticed a decrease in readings over the past week, with morning readings before medication or coffee intake ranging from 100-110/60-69. They have no other changes in lifestyle or medication to account for this decrease, other than the prednisone taper and cessation of tobacco use.  In addition to their blood pressure concerns, the patient also has a history of performance anxiety, for which they have found beta blockers to be beneficial. They express a desire to return to a low dose of propranolol for situational use, as they had done prior to starting prednisone.  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 They report that this condition is improving.       Outpatient Medications Prior to Visit  Medication Sig  Dispense Refill   Avanafil (STENDRA) 200 MG TABS TAKE 1 TABLET BY MOUTH ONCE DAILY 15 TO 30 MINUTES BEFORE INTERCOURSE AS DIRECTED 6 tablet 10   calcium carbonate (OS-CAL - DOSED IN MG OF ELEMENTAL CALCIUM) 1250 (500 Ca) MG tablet Take 1 tablet by mouth daily.     DULoxetine (CYMBALTA) 60 MG capsule TAKE 1 CAPSULE BY MOUTH EVERY DAY 90 capsule 1   fluticasone (FLONASE) 50 MCG/ACT nasal spray Place into both nostrils daily.     Loratadine (CLARITIN PO) Take by mouth.     Multiple Vitamin (MULTIVITAMIN) capsule Take 1 capsule by mouth daily.     nebivolol (BYSTOLIC) 5 MG tablet Take 1 tablet (5 mg total) by mouth daily. 90 tablet 1   omeprazole (PRILOSEC) 10 MG capsule Take 10 mg by mouth daily.     predniSONE (DELTASONE) 10 MG tablet TAKE 2 TABLETS BY MOUTH DAILY WITH BREAKFAST. 90 tablet 1   traZODone (DESYREL) 50 MG tablet TAKE 1 TABLET BY MOUTH EVERY DAY AT BEDTIME AS NEEDED 90 tablet 1   ustekinumab (STELARA) 90 MG/ML SOSY injection Inject 1 mL (90 mg total) into the skin every 8 (eight) weeks. 1 mL 5   VITAMIN D, CHOLECALCIFEROL, PO Take by mouth.     olmesartan (BENICAR) 20 MG tablet Take 1 tablet (20 mg total) by mouth daily. 90 tablet 1   No facility-administered medications prior to visit.    ROS Review of Systems  Constitutional:  Positive for fatigue. Negative for chills and diaphoresis.  HENT: Negative.    Eyes: Negative.  Respiratory:  Negative for cough, chest tightness, shortness of breath and wheezing.   Cardiovascular:  Negative for chest pain, palpitations and leg swelling.  Gastrointestinal:  Negative for abdominal pain, diarrhea, nausea and vomiting.  Genitourinary: Negative.  Negative for difficulty urinating.  Musculoskeletal: Negative.   Skin: Negative.   Neurological:  Positive for dizziness and light-headedness. Negative for weakness.  Hematological:  Negative for adenopathy. Does not bruise/bleed easily.  Psychiatric/Behavioral: Negative.      Objective:   BP 112/70 (BP Location: Left Arm, Patient Position: Sitting, Cuff Size: Large)   Pulse (!) 57   Temp 98.3 F (36.8 C) (Oral)   Ht 5\' 9"  (1.753 m)   Wt 177 lb (80.3 kg)   SpO2 99%   BMI 26.14 kg/m   BP Readings from Last 3 Encounters:  10/12/22 112/70  08/15/22 136/82  08/01/22 102/60    Wt Readings from Last 3 Encounters:  10/12/22 177 lb (80.3 kg)  08/15/22 176 lb (79.8 kg)  08/01/22 176 lb (79.8 kg)    Physical Exam Vitals reviewed.  Eyes:     General: No scleral icterus.    Conjunctiva/sclera: Conjunctivae normal.  Cardiovascular:     Rate and Rhythm: Regular rhythm. Bradycardia present.     Heart sounds: No murmur heard.    No friction rub. No gallop.  Pulmonary:     Effort: Pulmonary effort is normal.     Breath sounds: No stridor. No wheezing, rhonchi or rales.  Abdominal:     General: Abdomen is flat.     Palpations: There is no mass.     Tenderness: There is no abdominal tenderness. There is no guarding.     Hernia: No hernia is present.  Musculoskeletal:        General: Normal range of motion.     Cervical back: Neck supple.     Right lower leg: No edema.     Left lower leg: No edema.  Skin:    General: Skin is warm and dry.  Neurological:     General: No focal deficit present.     Mental Status: Mental status is at baseline.  Psychiatric:        Mood and Affect: Mood normal.        Behavior: Behavior normal.     Lab Results  Component Value Date   WBC 10.2 08/15/2022   HGB 13.8 08/15/2022   HCT 40.3 08/15/2022   PLT 308.0 08/15/2022   GLUCOSE 99 08/15/2022   CHOL 164 05/13/2020   TRIG 216.0 (H) 05/13/2020   HDL 51.30 05/13/2020   LDLDIRECT 101.0 05/13/2020   LDLCALC 105 (H) 11/29/2017   ALT 18 07/05/2022   AST 18 07/05/2022   NA 138 08/15/2022   K 4.2 08/15/2022   CL 100 08/15/2022   CREATININE 1.22 08/15/2022   BUN 17 08/15/2022   CO2 27 08/15/2022   TSH 0.64 05/16/2022    DG BONE DENSITY (DXA)  Result Date: 07/21/2022 Table  formatting from the original result was not included. Date of study: 07/20/2022 Exam: DUAL X-RAY ABSORPTIOMETRY (DXA) FOR BONE MINERAL DENSITY (BMD) Instrument: [employer] Requesting Provider: PCP Indication: follow up for low BMD Comparison: none (please note that it is not possible to compare data from different instruments) Clinical data: Pt is a 37 y.o. male without previous history of fracture. Results:  Lumbar spine L1-L4(L3) Femoral neck (FN) Z-score -3.3 RFN: -1.2 LFN: -1.4 Assessment: By the [facility] Criteria for diagnosis based on bone density, this  patient has Low Bone Density  ISCD recommends using Z-scores for assessment of pre-menopausal women, men between 51 and 73 yoa, and children under 70 yo. The diagnosis of osteoporosis in these patients should not be made on the basis of densitometric criteria alone.  Comments: the technical quality of the study is good. L3 vertebra had to be excluded from analysis due to DJD WHO criteria for diagnosis of osteoporosis in postmenopausal women and in men 5 y/o or older: - normal: T-score -1.0 to + 1.0 - osteopenia/low bone density: T-score between -2.5 and -1.0 - osteoporosis: T-score below -2.5 - severe osteoporosis: T-score below -2.5 with history of fragility fracture Note: although not part of the WHO classification, the presence of a fragility fracture, regardless of the T-score, should be considered diagnostic of osteoporosis, provided other causes for the fracture have been excluded. RECOMMENDATION: 1. All patients should optimize calcium and vitamin D intake. 2. Consider FDA-approved medical therapies in postmenopausal women and men aged 24 years and older, based on the following: a. A hip or vertebral(clinical or morphometric) fracture. b. T-Score of  -2.5 or less at the femoral neck , total hip or spine after appropriate evaluation to exclude secondary causes c. Low bone mass (T-score between -1.0 and -2.5 at the femoral neck or spine) and a 10 year  probability of a hip fracture >3% or a 10 year probability of major osteoporosis-related fracture > 20% based on the [place]-adapted WHO algorithm d. Clinical judgement and/or patient preferences may indicate treatment for people with 10-year fracture probabilities above or below these levels Follow up BMD is recommended: 2 years. Interpreted by : Lyndle Herrlich, MD [facility]    Assessment & Plan:   LVH (left ventricular hypertrophy) due to hypertensive disease, without heart failure- His blood pressure is overcontrolled and he is symptomatic.  Will discontinue the ARB.  Bradycardia- I have advised him to slowly taper off of nebivolol.     Follow-up: Return in about 6 months (around 04/13/2023).  Sanda Linger, MD

## 2022-10-13 ENCOUNTER — Ambulatory Visit: Payer: 59 | Admitting: Internal Medicine

## 2022-11-02 ENCOUNTER — Other Ambulatory Visit: Payer: Self-pay | Admitting: Internal Medicine

## 2022-11-02 ENCOUNTER — Telehealth: Payer: Self-pay

## 2022-11-02 NOTE — Telephone Encounter (Signed)
Blake [name] a detailed message regarding what Dr Leone Payor said.

## 2022-11-02 NOTE — Telephone Encounter (Signed)
Oct 1 OK - he just needs to message/call back if things not going well and we will get him in sooner

## 2022-11-02 NOTE — Telephone Encounter (Signed)
The pharmacy sent a request for his prednisone so I called him to confirm if he was still taking it. He has tapered down and has been off for over a week now. He reports that his UC symptoms are mild. He is using Imodium as needed. He said he is "feeling great" off the prednisone. He had to r/s his June appointment. They gave him an October appointment. Please advise Sir if that is okay or do we need to work him in sooner.

## 2022-11-14 ENCOUNTER — Other Ambulatory Visit (HOSPITAL_COMMUNITY): Payer: Self-pay

## 2022-11-15 ENCOUNTER — Other Ambulatory Visit (HOSPITAL_COMMUNITY): Payer: Self-pay

## 2022-11-15 ENCOUNTER — Telehealth: Payer: Self-pay | Admitting: Pharmacy Technician

## 2022-11-15 NOTE — Telephone Encounter (Signed)
Pharmacy Patient Advocate Encounter   Received notification from CoverMyMeds that prior authorization for [facility] 90MG  is required/requested.   Insurance verification completed.   The patient is insured through  [employer]  .   Per test claim: PA submitted to [employer] via Fax Key/confirmation #/EOC FAX# [phone] Status is pending

## 2022-11-15 NOTE — Telephone Encounter (Signed)
Pharmacy Patient Advocate Encounter  Received notification from  [employer]  that Prior Authorization for [facility]MG  has been APPROVED from 7.23.24 to 7.23.25.Marland Kitchen  PA #/Case ID/Reference #: [id number]

## 2023-01-06 ENCOUNTER — Encounter (HOSPITAL_COMMUNITY): Payer: Self-pay

## 2023-01-06 ENCOUNTER — Ambulatory Visit (HOSPITAL_COMMUNITY)
Admission: RE | Admit: 2023-01-06 | Discharge: 2023-01-06 | Disposition: A | Discharge status: Home / Self Care | Payer: 59 | Source: Ambulatory Visit | Attending: Family Medicine | Admitting: Family Medicine

## 2023-01-06 VITALS — BP 129/84 | HR 58 | Temp 97.9°F | Resp 16

## 2023-01-06 DIAGNOSIS — H60393 Other infective otitis externa, bilateral: Secondary | ICD-10-CM

## 2023-01-06 MED ORDER — OFLOXACIN 0.3 % OT SOLN: 5.0000 [drp] | Freq: Two times a day (BID) | OTIC | 0 refills | Status: AC

## 2023-01-06 NOTE — ED Triage Notes (Signed)
Pt states right ear pain for the past week.

## 2023-01-06 NOTE — ED Provider Notes (Signed)
[facility]    CSN: [id number] Arrival date & time: 01/06/23  1619      History   Chief Complaint Chief Complaint  Patient presents with   Ear Fullness    Ear pain and potential outer ear infection - Entered by patient    HPI Blake Chapman is a 37 y.o. male.    Ear Fullness  Here for right ear pain that's been going on for about a week. Now feeling full. He did try ear wax drops, without any relief. It hurts to touch that side.  No fever/chills. No URI symptoms  Past Medical History:  Diagnosis Date   Allergy    Anxiety    Central retinal vein occlusion of left eye 03/14/2022   Depression    GERD (gastroesophageal reflux disease)    Hx of adenomatous polyp of colon 08/01/2022   Neuromuscular disorder (HCC)    acute bil lower legs neuropathy   Substance abuse (HCC)    alcohol - previous   Universal ulcerative colitis (HCC)     Patient Active Problem List   Diagnosis Date Noted   Bradycardia 10/12/2022   Hx of adenomatous polyp of colon 08/01/2022   Long term (current) use of systemic steroids 07/05/2022   LVH (left ventricular hypertrophy) due to hypertensive disease, without heart failure 05/16/2022   Long-term use of immunosuppressant medication currently Stelara 03/14/2022   Central retinal vein occlusion of left eye 03/14/2022   Drug-induced erectile dysfunction 11/29/2017   Universal ulcerative colitis with complication (HCC) 11/17/2016   GAD (generalized anxiety disorder) 06/12/2014   Routine general medical examination at a health care facility 06/12/2014    Past Surgical History:  Procedure Laterality Date   clavical - right repair     2010   COLONOSCOPY     Multiple   HARDWARE REMOVAL  2011   HERNIA REPAIR  1991       Home Medications    Prior to Admission medications   Medication Sig Start Date End Date Taking? Authorizing Provider  ofloxacin (FLOXIN) 0.3 % OTIC solution Place 5 drops into both ears 2 (two) times daily for 7  days. 01/06/23 01/13/23 Yes Zenia Resides, MD  Avanafil (STENDRA) 200 MG TABS TAKE 1 TABLET BY MOUTH ONCE DAILY 15 TO 30 MINUTES BEFORE INTERCOURSE AS DIRECTED 03/28/22   Etta Grandchild, MD  calcium carbonate (OS-CAL - DOSED IN MG OF ELEMENTAL CALCIUM) 1250 (500 Ca) MG tablet Take 1 tablet by mouth daily.    [provider]  DULoxetine (CYMBALTA) 60 MG capsule TAKE 1 CAPSULE BY MOUTH EVERY DAY 09/25/22   Etta Grandchild, MD  fluticasone (FLONASE) 50 MCG/ACT nasal spray Place into both nostrils daily.    [provider]  Loratadine (CLARITIN PO) Take by mouth.    [provider]  Multiple Vitamin (MULTIVITAMIN) capsule Take 1 capsule by mouth daily.    [provider]  nebivolol (BYSTOLIC) 5 MG tablet Take 1 tablet (5 mg total) by mouth daily. 08/15/22   Etta Grandchild, MD  omeprazole (PRILOSEC) 10 MG capsule Take 10 mg by mouth daily.    [provider]  traZODone (DESYREL) 50 MG tablet TAKE 1 TABLET BY MOUTH EVERY DAY AT BEDTIME AS NEEDED 09/24/22   Etta Grandchild, MD  ustekinumab (STELARA) 90 MG/ML SOSY injection Inject 1 mL (90 mg total) into the skin every 8 (eight) weeks. 04/06/22   Iva Boop, MD  VITAMIN D, CHOLECALCIFEROL, PO Take by mouth.  [provider]    Family History Family History  Problem Relation Age of Onset   Thyroid cancer Mother    Crohn's disease Mother    Breast cancer Maternal Grandmother    Colon cancer Maternal Grandmother 28       died 2 weeks later   Esophageal cancer Maternal Grandfather        died mid 58's   Prostate cancer Maternal Uncle    Cancer Neg Hx    Alcohol abuse Neg Hx    Depression Neg Hx    Drug abuse Neg Hx    Early death Neg Hx    Stroke Neg Hx    Kidney disease Neg Hx    Hypertension Neg Hx    Hyperlipidemia Neg Hx    Pancreatic cancer Neg Hx    Rectal cancer Neg Hx    Stomach cancer Neg Hx     Social History Social History   Tobacco Use   Smoking status: Former     Types: Cigars   Smokeless tobacco: Never  Vaping Use   Vaping status: Never Used  Substance Use Topics   Alcohol use: No    Alcohol/week: 0.0 standard drinks of alcohol    Comment: Alcoholism- remission - Oct 2015   Drug use: No     Allergies   Patient has no known allergies.   Review of Systems Review of Systems   Physical Exam Triage Vital Signs ED Triage Vitals  Encounter Vitals Group     BP 01/06/23 1647 129/84     Systolic BP Percentile --      Diastolic BP Percentile --      Pulse Rate 01/06/23 1647 (!) 58     Resp 01/06/23 1647 16     Temp 01/06/23 1647 97.9 F (36.6 C)     Temp Source 01/06/23 1647 Oral     SpO2 01/06/23 1647 98 %     Weight --      Height --      Head Circumference --      Peak Flow --      Pain Score 01/06/23 1648 2     Pain Loc --      Pain Education --      Exclude from Growth Chart --    No data found.  Updated Vital Signs BP 129/84 (BP Location: Left Arm)   Pulse (!) 58   Temp 97.9 F (36.6 C) (Oral)   Resp 16   SpO2 98%   Visual Acuity Right Eye Distance:   Left Eye Distance:   Bilateral Distance:    Right Eye Near:   Left Eye Near:    Bilateral Near:     Physical Exam Vitals reviewed.  Constitutional:      General: He is not in acute distress.    Appearance: He is not ill-appearing, toxic-appearing or diaphoretic.  HENT:     Ears:     Comments: Tympanic membrane's are bilaterally gray and dull.  The right ear canal is swollen and erythematous.  There is some tenderness on traction of the right pinna.  The left ear canal has some mild erythema and some mild swelling of the canal there. Skin:    Coloration: Skin is not jaundiced or pale.  Neurological:     General: No focal deficit present.     Mental Status: He is alert and oriented to person, place, and time.  Psychiatric:        Behavior: Behavior  normal.      UC Treatments / Results  Labs (all labs ordered are listed, but only abnormal results are  displayed) Labs Reviewed - No data to display  EKG   Radiology No results found.  Procedures Procedures (including critical care time)  Medications Ordered in UC Medications - No data to display  Initial Impression / Assessment and Plan / UC Course  I have reviewed the triage vital signs and the nursing notes.  Pertinent labs & imaging results that were available during my care of the patient were reviewed by me and considered in my medical decision making (see chart for details).        Floxin drops are sent in for the otitis externa.  It does not appear that there is any cellulitis surrounding his ear.  He has been taking Tylenol and Aleve with good effect, so we will continue to do that Final Clinical Impressions(s) / UC Diagnoses   Final diagnoses:  Infective otitis externa of both ears     Discharge Instructions      -Floxin eardrops-5 drops in the affected ear 2 times daily for 7 days.      ED Prescriptions     Medication Sig Dispense Auth. Provider   ofloxacin (FLOXIN) 0.3 % OTIC solution Place 5 drops into both ears 2 (two) times daily for 7 days. 5 mL Zenia Resides, MD      PDMP not reviewed this encounter.   Zenia Resides, MD 01/06/23 [phone]

## 2023-01-06 NOTE — Discharge Instructions (Signed)
-  Floxin eardrops-5 drops in the affected ear 2 times daily for 7 days.

## 2023-01-24 ENCOUNTER — Ambulatory Visit (INDEPENDENT_AMBULATORY_CARE_PROVIDER_SITE_OTHER): Payer: 59 | Admitting: Internal Medicine

## 2023-01-24 ENCOUNTER — Encounter: Payer: Self-pay | Admitting: Internal Medicine

## 2023-01-24 ENCOUNTER — Other Ambulatory Visit: Payer: 59

## 2023-01-24 VITALS — BP 106/76 | HR 74 | Ht 69.0 in | Wt 172.0 lb

## 2023-01-24 DIAGNOSIS — Z860101 Personal history of adenomatous and serrated colon polyps: Secondary | ICD-10-CM

## 2023-01-24 DIAGNOSIS — Z7960 Long term (current) use of unspecified immunomodulators and immunosuppressants: Secondary | ICD-10-CM | POA: Diagnosis not present

## 2023-01-24 DIAGNOSIS — K51019 Ulcerative (chronic) pancolitis with unspecified complications: Secondary | ICD-10-CM | POA: Diagnosis not present

## 2023-01-24 NOTE — Patient Instructions (Signed)
Your provider has requested that you go to the basement level for lab work before leaving today. Press "B" on the elevator. The lab is located at the first door on the left as you exit the elevator.  Due to recent changes in healthcare laws, you may see the results of your imaging and laboratory studies on MyChart before your provider has had a chance to review them.  We understand that in some cases there may be results that are confusing or concerning to you. Not all laboratory results come back in the same time frame and the provider may be waiting for multiple results in order to interpret others.  Please give [place] 48 hours in order for your provider to thoroughly review all the results before contacting the office for clarification of your results.   Please come back to our lab on November 20th and have labs drawn. No appointment needed. The lab is open from 7:30am to 5:30pm.   I appreciate the opportunity to care for you. Stan Head, MD, [facility]

## 2023-01-24 NOTE — Progress Notes (Signed)
Blake Chapman 37 y.o. 31-Jul-1985 [id number]  Assessment & Plan:   Encounter Diagnoses  Name Primary?   Universal ulcerative colitis with complication (HCC) Yes   Long-term use of immunosuppressant medication- Stelara    Hx of adenomatous polyp of colon         Patient reports feeling well for the first 6-7 weeks after Stelara injection, with minor symptoms of body aches, fatigue, increased urgency, and frequency in the last week before the next dose. No diarrhea or blood. -Plan to check Stelara trough levels and antibodies before the next dose due on March 16, 2023. -Consider shortening the interval between Stelara doses based on lab results. -Perform annual tuberculosis test today. -Continue multivitamin with iron for slightly low iron levels. -Follow-up in 6 months if patient continues to do well, sooner if symptoms worsen.   -No longer on prednisone. -Heart rate and blood pressure normalized after discontinuing prednisone, no longer on blood pressure medications. -Recent otitis externa resolved.      Recall colonoscopy 07/2024  Orders Placed This Encounter  Procedures   QuantiFERON-TB Gold Plus   Ustekinumab and Anti-Ustek Ab     Subjective:  Review of pertinent gastrointestinal problems: 1. Distal UC; presented 2017 with intermittent rectal bleeding. Colonoscopy December 2017 Blake Chapman found moderate inflammation from rectum up to 25 cm. The rest of the colon was normal. Terminal ileum could not be intubated. Biopsies from the inflamed segment showed chronic active inflammation. Biopsies from non-inflamed appearing segments of colon in the right and left segments were all normal. He was started on mesalamine enema and mesalamine orally.  September 2018 flare, labs showed white blood cell count was 20k and I Put him on vancomycin, C. difficile by PCR was negative however. Eventual steroid pulse 40 mg once daily significantly helped him clinically. Aug 2019 flaring  again, sed rate 54, required steroids again.  CT scan 11/2017 confirmed inflammation distal descending and sigmoid colon.  Stool testing neg for infection. New start entyvio 11/2017. Labs August 2019: HIV negative, hepatitis B negative, hepatitis C negative.  TB quant gold indeterminant followed up by normal chest x-ray, followed up by negative PPD. 01/2019: entyvio Antibodies negative; entyvio trough drug leve 5.7; increased dosing to every 6 weeks instead of every 8   Colonoscopy Christella Chapman 07/20/2021-universal UC severe rectum to 30 cm mild throughout otherwise biopsies confirm and no signs of viral infection   Humira initiated late March early April 2023 40 mg every other week   Stelara initiated April 27, 2022 considered Rinvoq but he has hx central retinal vein occlusion - hypercoag w/u negative (hematology Blake Chapman)  Colonoscopy 4/24 - Pancolitis ulcerative colitis. Inflammation was found from the rectum to the sigmoid colon. This was mild in severity, improved compared to previous examinations. Biopsied. - Six 3 to 15 mm polyps in the rectum and in the sigmoid colon, removed with a hot snare. (Pseudopolyps) Resected and retrieved. Clip was placed on sigmoid site to prevent significant bleeding (selflimited ooze seen). - Two diminutive polyps in the descending colon (mucosal polyp) and in the cecum (adenoma), removed with a cold snare. Resected and retrieved. - The examined portion of the ileum was normal. - The descending colon, transverse colon, ascending colon and cecum are normal. Biopsied. - improved c/w prior mild colitis rectum and sigmoid all other mucosa NL on path  -------------------------------------------------------------------------------------------- Discussed the use of AI scribe software for clinical note transcription with the patient, who gave verbal consent to proceed.   Chief Complaint:  f/u ulcerative colitis  HPI 37 yop wm w/ ulcerative colitis as above (failed Entyvio  and Humira) who is is currently on Stelara every eight weeks since 04/27/2022. He reports feeling generally well on this regimen, with the first six to seven weeks post-injection being the best he's felt since diagnosis. However, he notes a decline in his condition in the last week to week and a half before his next dose, experiencing body aches and fatigue. These symptoms resolve within two to three days of his next injection. Overall says this is the best he has been since diagnosis.  In addition to these systemic symptoms, he reports minor bowel symptoms towards the end of his eight-week cycle. These include increased urgency and frequency, but no diarrhea or blood. He has not required prednisone for a significant period.  JD also had an ear infection, diagnosed as otitis externa, approximately two weeks ago. This was a first-time occurrence and the cause is unknown.   He also reports a history of hypertension, which has since resolved after discontinuing long-term prednisone. He is no longer on any blood pressure medications. His last labs in April showed resolved anemia and slightly low iron levels, which he is managing with a multivitamin believed to contain iron.        Wt Readings from Last 3 Encounters:  01/24/23 172 lb (78 kg)  10/12/22 177 lb (80.3 kg)  08/15/22 176 lb (79.8 kg)    No Known Allergies Current Meds  Medication Sig   Avanafil (STENDRA) 200 MG TABS TAKE 1 TABLET BY MOUTH ONCE DAILY 15 TO 30 MINUTES BEFORE INTERCOURSE AS DIRECTED   calcium carbonate (OS-CAL - DOSED IN MG OF ELEMENTAL CALCIUM) 1250 (500 Ca) MG tablet Take 1 tablet by mouth daily.   DULoxetine (CYMBALTA) 60 MG capsule TAKE 1 CAPSULE BY MOUTH EVERY DAY   fluticasone (FLONASE) 50 MCG/ACT nasal spray Place into both nostrils daily.   Loratadine (CLARITIN PO) Take by mouth.   Multiple Vitamin (MULTIVITAMIN) capsule Take 1 capsule by mouth daily.   omeprazole (PRILOSEC) 10 MG capsule Take 10 mg by mouth daily.    traZODone (DESYREL) 50 MG tablet TAKE 1 TABLET BY MOUTH EVERY DAY AT BEDTIME AS NEEDED   ustekinumab (STELARA) 90 MG/ML SOSY injection Inject 1 mL (90 mg total) into the skin every 8 (eight) weeks.   VITAMIN D, CHOLECALCIFEROL, PO Take by mouth.   Past Medical History:  Diagnosis Date   Allergy    Anxiety    Central retinal vein occlusion of left eye 03/14/2022   Depression    GERD (gastroesophageal reflux disease)    Hx of adenomatous polyp of colon 08/01/2022   Neuromuscular disorder (HCC)    acute bil lower legs neuropathy   Substance abuse (HCC)    alcohol - previous   Universal ulcerative colitis (HCC)    Past Surgical History:  Procedure Laterality Date   clavical - right repair     2010   COLONOSCOPY     Multiple   HARDWARE REMOVAL  2011   HERNIA REPAIR  1991   Social History   Social History Narrative   Single, he is a [employer] and [employer]   Alcoholism in remission   Cigar smoker occasionally   No drug use   family history includes Breast cancer in his maternal grandmother; Colon cancer (age of onset: 34) in his maternal grandmother; Crohn's disease in his mother; Esophageal cancer in his maternal grandfather; Prostate cancer in his maternal uncle;  Thyroid cancer in his mother.   Review of Systems As above  Objective:   Physical Exam BP 106/76   Pulse 74   Ht 5\' 9"  (1.753 m)   Wt 172 lb (78 kg)   SpO2 98%   BMI 25.40 kg/m   CHEST: lungs clear to auscultation CARDIOVASCULAR: normal heart sounds ABDOMEN: soft, nontender, no hernia

## 2023-01-26 LAB — QUANTIFERON-TB GOLD PLUS
Mitogen-NIL: 7.88 [IU]/mL
NIL: 0.01 [IU]/mL
QuantiFERON-TB Gold Plus: NEGATIVE
TB1-NIL: 0.01 [IU]/mL
TB2-NIL: 0.02 [IU]/mL

## 2023-03-01 ENCOUNTER — Other Ambulatory Visit: Payer: Self-pay | Admitting: Internal Medicine

## 2023-03-01 NOTE — Telephone Encounter (Signed)
Please review and advise on renewal of medication

## 2023-03-02 NOTE — Telephone Encounter (Signed)
Rx signed.

## 2023-03-15 ENCOUNTER — Other Ambulatory Visit: Payer: 59

## 2023-03-15 DIAGNOSIS — Z7960 Long term (current) use of unspecified immunomodulators and immunosuppressants: Secondary | ICD-10-CM

## 2023-03-15 DIAGNOSIS — K51019 Ulcerative (chronic) pancolitis with unspecified complications: Secondary | ICD-10-CM

## 2023-03-30 ENCOUNTER — Other Ambulatory Visit: Payer: Self-pay | Admitting: Internal Medicine

## 2023-03-30 DIAGNOSIS — F411 Generalized anxiety disorder: Secondary | ICD-10-CM

## 2023-04-02 LAB — SERIAL MONITORING

## 2023-04-03 LAB — USTEKINUMAB AND ANTI-USTEK AB
Anti-Ustekinumab Antibody: <40 ng/mL
Ustekinumab: 4.3 ug/mL

## 2023-07-03 ENCOUNTER — Other Ambulatory Visit: Payer: Self-pay | Admitting: Internal Medicine

## 2023-07-03 DIAGNOSIS — F411 Generalized anxiety disorder: Secondary | ICD-10-CM

## 2023-07-03 MED ORDER — TRAZODONE HCL 50 MG PO TABS: ORAL_TABLET | ORAL | 0 refills | Status: DC

## 2023-07-03 MED ORDER — DULOXETINE HCL 60 MG PO CPEP: 60.0000 mg | ORAL_CAPSULE | Freq: Every day | ORAL | 0 refills | Status: DC

## 2023-07-03 NOTE — Telephone Encounter (Signed)
 Copied from CRM [id number]. Topic: Clinical - Medication Refill >> Jul 03, 2023  8:20 AM Truddie Crumble wrote: Most Recent Primary Care Visit:  Provider: Etta Grandchild  Department: [facility]  Visit Type: OFFICE VISIT  Date: 10/12/2022  Medication: DULoxetine (CYMBALTA) 60 MG capsule and traZODone (DESYREL) 50 MG tablet  Has the patient contacted their pharmacy? Yes (Agent: If no, request that the patient contact the pharmacy for the refill. If patient does not wish to contact the pharmacy document the reason why and proceed with request.) (Agent: If yes, when and what did the pharmacy advise?)  Is this the correct pharmacy for this prescription? Yes If no, delete pharmacy and type the correct one.  This is the patient's preferred pharmacy:  [id number] [place], [place] - [address] [place] [address] Phone: [phone] Fax: [phone]  Has the prescription been filled recently? No  Is the patient out of the medication? Yes  Has the patient been seen for an appointment in the last year OR does the patient have an upcoming appointment? Yes  Can we respond through MyChart? Yes  Agent: Please be advised that Rx refills may take up to 3 business days. We ask that you follow-up with your pharmacy.

## 2023-07-13 ENCOUNTER — Encounter: Payer: Self-pay | Admitting: Internal Medicine

## 2023-07-14 ENCOUNTER — Other Ambulatory Visit: Payer: Self-pay | Admitting: Internal Medicine

## 2023-07-14 DIAGNOSIS — Z3141 Encounter for fertility testing: Secondary | ICD-10-CM

## 2023-08-01 ENCOUNTER — Encounter: Payer: Self-pay | Admitting: Internal Medicine

## 2023-08-01 ENCOUNTER — Ambulatory Visit: Admitting: Internal Medicine

## 2023-08-01 VITALS — BP 132/86 | HR 64 | Temp 98.7°F | Resp 16 | Ht 69.0 in | Wt 169.4 lb

## 2023-08-01 DIAGNOSIS — Z0001 Encounter for general adult medical examination with abnormal findings: Secondary | ICD-10-CM | POA: Diagnosis not present

## 2023-08-01 DIAGNOSIS — Z0000 Encounter for general adult medical examination without abnormal findings: Secondary | ICD-10-CM

## 2023-08-01 DIAGNOSIS — F418 Other specified anxiety disorders: Secondary | ICD-10-CM

## 2023-08-01 DIAGNOSIS — Z13220 Encounter for screening for lipoid disorders: Secondary | ICD-10-CM | POA: Diagnosis not present

## 2023-08-01 DIAGNOSIS — B351 Tinea unguium: Secondary | ICD-10-CM

## 2023-08-01 DIAGNOSIS — R001 Bradycardia, unspecified: Secondary | ICD-10-CM

## 2023-08-01 DIAGNOSIS — I119 Hypertensive heart disease without heart failure: Secondary | ICD-10-CM

## 2023-08-01 DIAGNOSIS — I10 Essential (primary) hypertension: Secondary | ICD-10-CM | POA: Diagnosis not present

## 2023-08-01 DIAGNOSIS — F411 Generalized anxiety disorder: Secondary | ICD-10-CM | POA: Diagnosis not present

## 2023-08-01 LAB — BASIC METABOLIC PANEL WITH GFR
BUN: 14 mg/dL (ref 6–23)
CO2: 29 meq/L (ref 19–32)
Calcium: 9.7 mg/dL (ref 8.4–10.5)
Chloride: 99 meq/L (ref 96–112)
Creatinine, Ser: 1.01 mg/dL (ref 0.40–1.50)
GFR: 93.4 mL/min (ref >60.00)
Glucose, Bld: 85 mg/dL (ref 70–99)
Potassium: 4 meq/L (ref 3.5–5.1)
Sodium: 137 meq/L (ref 135–145)

## 2023-08-01 LAB — LIPID PANEL
Cholesterol: 216 mg/dL — ABNORMAL HIGH (ref 0–200)
HDL: 55.1 mg/dL (ref >39.00)
LDL Cholesterol: 135 mg/dL — ABNORMAL HIGH (ref 0–99)
NonHDL: 161.13
Total CHOL/HDL Ratio: 4
Triglycerides: 132 mg/dL (ref 0.0–149.0)
VLDL: 26.4 mg/dL (ref 0.0–40.0)

## 2023-08-01 LAB — CBC WITH DIFFERENTIAL/PLATELET
Basophils Absolute: 0 10*3/uL (ref 0.0–0.1)
Basophils Relative: 0.4 % (ref 0.0–3.0)
Eosinophils Absolute: 0.2 10*3/uL (ref 0.0–0.7)
Eosinophils Relative: 3.1 % (ref 0.0–5.0)
HCT: 41.4 % (ref 39.0–52.0)
Hemoglobin: 14 g/dL (ref 13.0–17.0)
Lymphocytes Relative: 16.8 % (ref 12.0–46.0)
Lymphs Abs: 1.3 10*3/uL (ref 0.7–4.0)
MCHC: 33.9 g/dL (ref 30.0–36.0)
MCV: 88.7 fl (ref 78.0–100.0)
Monocytes Absolute: 0.5 10*3/uL (ref 0.1–1.0)
Monocytes Relative: 6.6 % (ref 3.0–12.0)
Neutro Abs: 5.5 10*3/uL (ref 1.4–7.7)
Neutrophils Relative %: 73.1 % (ref 43.0–77.0)
Platelets: 247 10*3/uL (ref 150.0–400.0)
RBC: 4.67 Mil/uL (ref 4.22–5.81)
RDW: 13.9 % (ref 11.5–15.5)
WBC: 7.5 10*3/uL (ref 4.0–10.5)

## 2023-08-01 LAB — HEPATIC FUNCTION PANEL
ALT: 20 U/L (ref 0–53)
AST: 22 U/L (ref 0–37)
Albumin: 4.8 g/dL (ref 3.5–5.2)
Alkaline Phosphatase: 68 U/L (ref 39–117)
Bilirubin, Direct: 0.1 mg/dL (ref 0.0–0.3)
Total Bilirubin: 0.5 mg/dL (ref 0.2–1.2)
Total Protein: 7.6 g/dL (ref 6.0–8.3)

## 2023-08-01 LAB — TSH: TSH: 1.95 u[IU]/mL (ref 0.35–5.50)

## 2023-08-01 MED ORDER — PROPRANOLOL HCL 10 MG PO TABS: 10.0000 mg | ORAL_TABLET | Freq: Every day | ORAL | 1 refills | Status: DC | PRN

## 2023-08-01 NOTE — Patient Instructions (Signed)
 Health Maintenance, Male  Adopting a healthy lifestyle and getting preventive care are important in promoting health and wellness. Ask your health care provider about:  The right schedule for you to have regular tests and exams.  Things you can do on your own to prevent diseases and keep yourself healthy.  What should I know about diet, weight, and exercise?  Eat a healthy diet    Eat a diet that includes plenty of vegetables, fruits, low-fat dairy products, and lean protein.  Do not eat a lot of foods that are high in solid fats, added sugars, or sodium.  Maintain a healthy weight  Body mass index (BMI) is a measurement that can be used to identify possible weight problems. It estimates body fat based on height and weight. Your health care provider can help determine your BMI and help you achieve or maintain a healthy weight.  Get regular exercise  Get regular exercise. This is one of the most important things you can do for your health. Most adults should:  Exercise for at least 150 minutes each week. The exercise should increase your heart rate and make you sweat (moderate-intensity exercise).  Do strengthening exercises at least twice a week. This is in addition to the moderate-intensity exercise.  Spend less time sitting. Even light physical activity can be beneficial.  Watch cholesterol and blood lipids  Have your blood tested for lipids and cholesterol at 40 years of age, then have this test every 5 years.  You may need to have your cholesterol levels checked more often if:  Your lipid or cholesterol levels are high.  You are older than 40 years of age.  You are at high risk for heart disease.  What should I know about cancer screening?  Many types of cancers can be detected early and may often be prevented. Depending on your health history and family history, you may need to have cancer screening at various ages. This may include screening for:  Colorectal cancer.  Prostate cancer.  Skin cancer.  Lung  cancer.  What should I know about heart disease, diabetes, and high blood pressure?  Blood pressure and heart disease  High blood pressure causes heart disease and increases the risk of stroke. This is more likely to develop in people who have high blood pressure readings or are overweight.  Talk with your health care provider about your target blood pressure readings.  Have your blood pressure checked:  Every 3-5 years if you are 9-95 years of age.  Every year if you are 85 years old or older.  If you are between the ages of 29 and 29 and are a current or former smoker, ask your health care provider if you should have a one-time screening for abdominal aortic aneurysm (AAA).  Diabetes  Have regular diabetes screenings. This checks your fasting blood sugar level. Have the screening done:  Once every three years after age 23 if you are at a normal weight and have a low risk for diabetes.  More often and at a younger age if you are overweight or have a high risk for diabetes.  What should I know about preventing infection?  Hepatitis B  If you have a higher risk for hepatitis B, you should be screened for this virus. Talk with your health care provider to find out if you are at risk for hepatitis B infection.  Hepatitis C  Blood testing is recommended for:  Everyone born from [birth year] through 1967.  Anyone  with known risk factors for hepatitis C.  Sexually transmitted infections (STIs)  You should be screened each year for STIs, including gonorrhea and chlamydia, if:  You are sexually active and are younger than 40 years of age.  You are older than 40 years of age and your health care provider tells you that you are at risk for this type of infection.  Your sexual activity has changed since you were last screened, and you are at increased risk for chlamydia or gonorrhea. Ask your health care provider if you are at risk.  Ask your health care provider about whether you are at high risk for HIV. Your health care provider  may recommend a prescription medicine to help prevent HIV infection. If you choose to take medicine to prevent HIV, you should first get tested for HIV. You should then be tested every 3 months for as long as you are taking the medicine.  Follow these instructions at home:  Alcohol use  Do not drink alcohol if your health care provider tells you not to drink.  If you drink alcohol:  Limit how much you have to 0-2 drinks a day.  Know how much alcohol is in your drink. In the [place] one drink equals one 12 oz bottle of beer (355 mL), one 5 oz glass of wine (148 mL), or one 1 oz glass of hard liquor (44 mL).  Lifestyle  Do not use any products that contain nicotine or tobacco. These products include cigarettes, chewing tobacco, and vaping devices, such as e-cigarettes. If you need help quitting, ask your health care provider.  Do not use street drugs.  Do not share needles.  Ask your health care provider for help if you need support or information about quitting drugs.  General instructions  Schedule regular health, dental, and eye exams.  Stay current with your vaccines.  Tell your health care provider if:  You often feel depressed.  You have ever been abused or do not feel safe at home.  Summary  Adopting a healthy lifestyle and getting preventive care are important in promoting health and wellness.  Follow your health care provider's instructions about healthy diet, exercising, and getting tested or screened for diseases.  Follow your health care provider's instructions on monitoring your cholesterol and blood pressure.  This information is not intended to replace advice given to you by your health care provider. Make sure you discuss any questions you have with your health care provider.  Document Revised: 08/31/2020 Document Reviewed: 08/31/2020  Elsevier Patient Education  2024 [employer].

## 2023-08-01 NOTE — Progress Notes (Unsigned)
 Subjective:  Patient ID: Blake Chapman, male    DOB: 1985-05-17  Age: 38 y.o. MRN: [id number]  CC: Annual Exam and Hypertension   HPI Blake Chapman presents for a CPX and f/up -----   Discussed the use of AI scribe software for clinical note transcription with the patient, who gave verbal consent to proceed.  History of Present Illness   Blake Chapman "[name]" is a 38 year old male with ulcerative colitis who presents for a follow-up visit.  Blake Chapman has been managing ulcerative colitis with Stelara for over a year. Initially, the medication took time to become effective, but Blake Chapman now experiences significant improvement in his health. Previously, Blake Chapman had high blood pressure and other complications from prolonged prednisone use, but Blake Chapman has successfully lost twenty pounds since reducing prednisone. No chest, abdominal, or testicular pain or swelling.  Blake Chapman is experiencing gradual improvement in an eye issue related to a vein blockage. The frequency of injections for this condition has been extended to every eleven to twelve weeks, indicating stabilization.  Blake Chapman has been using tadalafil 5 mg for several months, which Blake Chapman finds effective. Blake Chapman previously tried [place] but did not find it satisfactory.  Blake Chapman has a persistent toenail fungus affecting both feet, which has been present for years. Blake Chapman has not sought treatment for this condition yet.       Blake Chapman inquires about renewing a prescription for propranolol for situational anxiety, which Blake Chapman found helpful in the past for events like court hearings or depositions.   Outpatient Medications Prior to Visit  Medication Sig Dispense Refill   Aflibercept (EYLEA) 2 MG/0.05ML SOLN by Intravitreal route. Every 12 weeks.     calcium carbonate (OS-CAL - DOSED IN MG OF ELEMENTAL CALCIUM) 1250 (500 Ca) MG tablet Take 1 tablet by mouth daily.     DULoxetine (CYMBALTA) 60 MG capsule Take 1 capsule (60 mg total) by mouth daily. 90 capsule 0   fluticasone (FLONASE) 50 MCG/ACT  nasal spray Place into both nostrils daily.     Loratadine (CLARITIN PO) Take by mouth.     Multiple Vitamin (MULTIVITAMIN) capsule Take 1 capsule by mouth daily.     omeprazole (PRILOSEC) 10 MG capsule Take 10 mg by mouth daily.     STELARA 90 MG/ML SOSY injection INJECT 1 SYRINGE SUBCUTANEOUSLY  EVERY 8 WEEKS 1 mL 5   tadalafil (CIALIS) 5 MG tablet Take 5 mg by mouth daily.     traZODone (DESYREL) 50 MG tablet TAKE 1 TABLET BY MOUTH EVERY DAY AT BEDTIME AS NEEDED 90 tablet 0   VITAMIN D, CHOLECALCIFEROL, PO Take by mouth.     Avanafil (STENDRA) 200 MG TABS TAKE 1 TABLET BY MOUTH ONCE DAILY 15 TO 30 MINUTES BEFORE INTERCOURSE AS DIRECTED 6 tablet 10   No facility-administered medications prior to visit.    ROS Review of Systems  Objective:  BP 132/86 (BP Location: Left Arm, Patient Position: Sitting, Cuff Size: Normal)   Pulse 64   Temp 98.7 F (37.1 C) (Oral)   Resp 16   Ht 5\' 9"  (1.753 m)   Wt 169 lb 6.4 oz (76.8 kg)   SpO2 99%   BMI 25.02 kg/m   BP Readings from Last 3 Encounters:  08/01/23 132/86  01/24/23 106/76  01/06/23 129/84    Wt Readings from Last 3 Encounters:  08/01/23 169 lb 6.4 oz (76.8 kg)  01/24/23 172 lb (78 kg)  10/12/22 177 lb (80.3 kg)    Physical Exam  Cardiovascular:     Rate and Rhythm: Bradycardia present.     Comments: EKG- SB, 58 bpm +++ LVH and early repolarization No Q waves Unchanged Musculoskeletal:     Right lower leg: No edema.     Left lower leg: No edema.     Lab Results  Component Value Date   WBC 7.5 08/01/2023   HGB 14.0 08/01/2023   HCT 41.4 08/01/2023   PLT 247.0 08/01/2023   GLUCOSE 85 08/01/2023   CHOL 216 (H) 08/01/2023   TRIG 132.0 08/01/2023   HDL 55.10 08/01/2023   LDLDIRECT 101.0 05/13/2020   LDLCALC 135 (H) 08/01/2023   ALT 20 08/01/2023   AST 22 08/01/2023   NA 137 08/01/2023   K 4.0 08/01/2023   CL 99 08/01/2023   CREATININE 1.01 08/01/2023   BUN 14 08/01/2023   CO2 29 08/01/2023   TSH 1.95  08/01/2023    No results found.  Assessment & Plan:  Onychomycosis of toenail -     Hepatic function panel; Future -     Terbinafine HCl; Take 1 tablet (250 mg total) by mouth daily.  Dispense: 30 tablet; Refill: 0  GAD (generalized anxiety disorder) -     TSH; Future  Routine general medical examination at a health care facility -     Lipid panel; Future  LVH (left ventricular hypertrophy) due to hypertensive disease, without heart failure -     Urinalysis, Routine w reflex microscopic; Future  Bradycardia -     TSH; Future  Primary hypertension -     Urinalysis, Routine w reflex microscopic; Future -     TSH; Future -     Hepatic function panel; Future -     Basic metabolic panel with GFR; Future -     CBC with Differential/Platelet; Future -     EKG 12-Lead  Situational anxiety -     Propranolol HCl; Take 1 tablet (10 mg total) by mouth daily as needed.  Dispense: 30 tablet; Refill: 1     Follow-up: Return in about 6 months (around 01/31/2024).  Sanda Linger, MD

## 2023-08-02 LAB — URINALYSIS, ROUTINE W REFLEX MICROSCOPIC
Bilirubin Urine: NEGATIVE
Hgb urine dipstick: NEGATIVE
Ketones, ur: NEGATIVE
Leukocytes,Ua: NEGATIVE
Nitrite: NEGATIVE
RBC / HPF: NONE SEEN (ref 0–?)
Specific Gravity, Urine: <=1.005 — AB (ref 1.000–1.030)
Total Protein, Urine: NEGATIVE
Urine Glucose: NEGATIVE
Urobilinogen, UA: 0.2 (ref 0.0–1.0)
WBC, UA: NONE SEEN (ref 0–?)
pH: 6.5 (ref 5.0–8.0)

## 2023-08-03 MED ORDER — TERBINAFINE HCL 250 MG PO TABS
250.0000 mg | ORAL_TABLET | Freq: Every day | ORAL | 0 refills | Status: DC
Start: 2023-08-03 — End: 2023-08-30

## 2023-08-04 ENCOUNTER — Encounter: Payer: Self-pay | Admitting: Internal Medicine

## 2023-08-04 NOTE — Assessment & Plan Note (Signed)
 The ASCVD Risk score (Arnett DK, et al., 2019) failed to calculate for the following reasons:  The 2019 ASCVD risk score is only valid for ages 69 to 65

## 2023-08-09 ENCOUNTER — Ambulatory Visit (INDEPENDENT_AMBULATORY_CARE_PROVIDER_SITE_OTHER): Payer: 59 | Admitting: Internal Medicine

## 2023-08-09 ENCOUNTER — Encounter: Payer: Self-pay | Admitting: Internal Medicine

## 2023-08-09 VITALS — BP 108/68 | HR 74 | Ht 69.0 in | Wt 170.0 lb

## 2023-08-09 DIAGNOSIS — Z7960 Long term (current) use of unspecified immunomodulators and immunosuppressants: Secondary | ICD-10-CM | POA: Diagnosis not present

## 2023-08-09 DIAGNOSIS — Z111 Encounter for screening for respiratory tuberculosis: Secondary | ICD-10-CM

## 2023-08-09 DIAGNOSIS — K5190 Ulcerative colitis, unspecified, without complications: Secondary | ICD-10-CM

## 2023-08-09 DIAGNOSIS — Z860101 Personal history of adenomatous and serrated colon polyps: Secondary | ICD-10-CM | POA: Diagnosis not present

## 2023-08-09 DIAGNOSIS — K5100 Ulcerative (chronic) pancolitis without complications: Secondary | ICD-10-CM

## 2023-08-09 NOTE — Progress Notes (Unsigned)
 Blake Chapman 38 y.o. [birth year] [id number]  Assessment & Plan:   Encounter Diagnoses  Name Primary?   Universal ulcerative colitis without complication (HCC) Yes   Long-term use of immunosuppressant medication- Stelara    Hx of adenomatous polyp of colon    Screening for tuberculosis    The patient is doing well at this time.  Will continue current therapy with Stelara and he will have a QuantiFERON test when it is due in October. RTC 9mos Routine colonoscopy April 2026, approximately.  Sooner if signs or symptoms warrant.    Orders Placed This Encounter  Procedures   QuantiFERON-TB Gold Plus     Subjective:  Review of pertinent gastrointestinal problems: 1. Distal UC; presented 2017 with intermittent rectal bleeding. Colonoscopy December 2017 Dr. Christella Hartigan found moderate inflammation from rectum up to 25 cm. The rest of the colon was normal. Terminal ileum could not be intubated. Biopsies from the inflamed segment showed chronic active inflammation. Biopsies from non-inflamed appearing segments of colon in the right and left segments were all normal. He was started on mesalamine enema and mesalamine orally.  September 2018 flare, labs showed white blood cell count was 20k and I Put him on vancomycin, C. difficile by PCR was negative however. Eventual steroid pulse 40 mg once daily significantly helped him clinically. Aug 2019 flaring again, sed rate 54, required steroids again.  CT scan 11/2017 confirmed inflammation distal descending and sigmoid colon.  Stool testing neg for infection. New start entyvio 11/2017. Labs August 2019: HIV negative, hepatitis B negative, hepatitis C negative.  TB quant gold indeterminant followed up by normal chest x-ray, followed up by negative PPD. 01/2019: entyvio Antibodies negative; entyvio trough drug leve 5.7; increased dosing to every 6 weeks instead of every 8   Humira initiated late March early April 2023 40 mg every other week   Stelara  initiated April 27, 2022 considered Rinvoq but he has hx central retinal vein occlusion - hypercoag w/u negative (hematology Dr. Leonides Schanz)   ustekinumab level 4.311 2024 with no antibodies.   Most recent most recent colonoscopy 4/24 - Pancolitis ulcerative colitis. Inflammation was found from the rectum to the sigmoid colon. This was mild in severity, improved compared to previous examinations. Biopsied. - Six 3 to 15 mm polyps in the rectum and in the sigmoid colon, removed with a hot snare. (Pseudopolyps) Resected and retrieved. Clip was placed on sigmoid site to prevent significant bleeding (selflimited ooze seen). - Two diminutive polyps in the descending colon (mucosal polyp) and in the cecum (adenoma), removed with a cold snare. Resected and retrieved. - The examined portion of the ileum was normal. - The descending colon, transverse colon, ascending colon and cecum are normal. Biopsied. - improved c/w prior mild colitis rectum and sigmoid all other mucosa NL on path routine repeat planned for 2026   History of adenomatous colon polyp April 2024  ----------------------------------------------------------------------------------------------   Chief Complaint: Follow-up of ulcerative colitis  HPI 38 year old man with a history of Universal ulcerative colitis now on Stelara as outlined above who returns reporting that he is having normal formed bowel movements and doing well as he has in many years.  No bleeding no abdominal pain.  No other infections or health problems associated.  He is very pleased.  He had labs in early April with Dr. Yetta Barre, his PCP.  TSH normal as were LFTs and BMET.  CBC normal.  No Known Allergies Current Meds  Medication Sig   Aflibercept (EYLEA) 2 MG/0.05ML SOLN  by Intravitreal route. Every 12 weeks.   calcium carbonate (OS-CAL - DOSED IN MG OF ELEMENTAL CALCIUM) 1250 (500 Ca) MG tablet Take 1 tablet by mouth daily.   DULoxetine (CYMBALTA) 60 MG capsule Take 1  capsule (60 mg total) by mouth daily.   fluticasone (FLONASE) 50 MCG/ACT nasal spray Place into both nostrils daily.   Loratadine (CLARITIN PO) Take by mouth.   Multiple Vitamin (MULTIVITAMIN) capsule Take 1 capsule by mouth daily.   omeprazole (PRILOSEC) 10 MG capsule Take 10 mg by mouth daily.   propranolol (INDERAL) 10 MG tablet Take 1 tablet (10 mg total) by mouth daily as needed.   STELARA 90 MG/ML SOSY injection INJECT 1 SYRINGE SUBCUTANEOUSLY  EVERY 8 WEEKS   tadalafil (CIALIS) 5 MG tablet Take 5 mg by mouth daily.   terbinafine (LAMISIL) 250 MG tablet Take 1 tablet (250 mg total) by mouth daily.   traZODone (DESYREL) 50 MG tablet TAKE 1 TABLET BY MOUTH EVERY DAY AT BEDTIME AS NEEDED   VITAMIN D, CHOLECALCIFEROL, PO Take by mouth.   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   Review of Systems As per HPI  Objective:   Physical Exam @BP  108/68   Pulse 74 @  General:  NAD Eyes:   anicteric Lungs:  clear Heart::  S1S2 no rubs, murmurs or  gallops Abdomen:  soft and nontender, BS+ Ext:   no edema, cyanosis or clubbing    Data Reviewed:  See HPI

## 2023-08-09 NOTE — Patient Instructions (Addendum)
 You are doing well on Stelara , no medications changes needed  Follow up in 9 months  We have put in orders for you to have labs in October  _______________________________________________________  If your blood pressure at your visit was 140/90 or greater, please contact your primary care physician to follow up on this.  _______________________________________________________  If you are age 40 or older, your body mass index should be between 23-30. Your There is no height or weight on file to calculate BMI. If this is out of the aforementioned range listed, please consider follow up with your Primary Care Provider.  If you are age 97 or younger, your body mass index should be between 19-25. Your There is no height or weight on file to calculate BMI. If this is out of the aformentioned range listed, please consider follow up with your Primary Care Provider.   ________________________________________________________  The [facility] providers would like to encourage you to use MYCHART to communicate with providers for non-urgent requests or questions.  Due to long hold times on the telephone, sending your provider a message by [facility] may be a faster and more efficient way to get a response.  Please allow 48 business hours for a response.  Please remember that this is for non-urgent requests.  _______________________________________________________   I appreciate the  opportunity to care for you  Thank You   Blake Chapman

## 2023-08-27 ENCOUNTER — Other Ambulatory Visit: Payer: Self-pay | Admitting: Internal Medicine

## 2023-08-27 DIAGNOSIS — F418 Other specified anxiety disorders: Secondary | ICD-10-CM

## 2023-08-30 ENCOUNTER — Other Ambulatory Visit: Payer: Self-pay | Admitting: Internal Medicine

## 2023-08-30 DIAGNOSIS — B351 Tinea unguium: Secondary | ICD-10-CM

## 2023-09-02 MED ORDER — TERBINAFINE HCL 250 MG PO TABS: 250.0000 mg | ORAL_TABLET | Freq: Every day | ORAL | 0 refills | Status: DC

## 2023-09-05 ENCOUNTER — Telehealth: Payer: Self-pay | Admitting: Internal Medicine

## 2023-09-05 NOTE — Telephone Encounter (Signed)
 Please ask him if he can send a copy of the notification via My Chart  Also tell him, in general, we are at the mercy of the formulary of the insurance and overall biosimilars have equivalent results, though I can understand his concern.

## 2023-09-05 NOTE — Telephone Encounter (Signed)
 The pt wants to know what Dr Willy Harvest recommends about the change to Wezlana (biosimilar). He states it has been difficult to find a medication that works.  Please advise

## 2023-09-05 NOTE — Telephone Encounter (Signed)
 Patient called and stated that he received a letter from his insurance stated that they will no longer be covering the medication stelara  and that he would need to consider a alternative called WEZLANA. Patient is not sure what he needs to do and is requesting to speak to a nurse. Please advise.

## 2023-09-05 NOTE — Telephone Encounter (Signed)
 I spoke with the pt and he will attach a copy and upload to My Chart. He will await a response from Dr Willy Harvest

## 2023-09-06 ENCOUNTER — Other Ambulatory Visit (HOSPITAL_COMMUNITY): Payer: Self-pay

## 2023-09-06 ENCOUNTER — Encounter: Payer: Self-pay | Admitting: Internal Medicine

## 2023-09-06 MED ORDER — WEZLANA 90 MG/ML ~~LOC~~ SOSY: 90.0000 mg | PREFILLED_SYRINGE | SUBCUTANEOUS | 6 refills | Status: AC

## 2023-09-06 NOTE — Addendum Note (Signed)
 Addended by: Aneita Keens on: 09/06/2023 12:51 PM   Modules accepted: Orders

## 2023-09-11 ENCOUNTER — Other Ambulatory Visit (HOSPITAL_COMMUNITY): Payer: Self-pay

## 2023-09-11 ENCOUNTER — Telehealth: Payer: Self-pay

## 2023-09-11 NOTE — Telephone Encounter (Signed)
 Pharmacy Patient Advocate Encounter  Received notification from [facility] that Prior Authorization for Wezlana 90MG /ML syringes has been APPROVED from 09-11-2023 to 03-13-2024

## 2023-09-11 NOTE — Telephone Encounter (Signed)
 Pharmacy Patient Advocate Encounter   Received notification from CoverMyMeds that prior authorization for Wezlana 90MG /ML syringes is required/requested.   Insurance verification completed.   The patient is insured through [facility] .   Per test claim: PA required; PA submitted to above mentioned insurance via Fax Key/confirmation #/EOC [employer] Status is pending

## 2023-09-29 ENCOUNTER — Other Ambulatory Visit: Payer: Self-pay | Admitting: Internal Medicine

## 2023-09-29 DIAGNOSIS — F411 Generalized anxiety disorder: Secondary | ICD-10-CM

## 2023-11-27 ENCOUNTER — Other Ambulatory Visit: Payer: Self-pay | Admitting: Internal Medicine

## 2023-11-27 DIAGNOSIS — B351 Tinea unguium: Secondary | ICD-10-CM

## 2023-11-28 ENCOUNTER — Other Ambulatory Visit: Payer: Self-pay | Admitting: Internal Medicine

## 2023-11-28 DIAGNOSIS — F418 Other specified anxiety disorders: Secondary | ICD-10-CM

## 2023-11-29 ENCOUNTER — Telehealth: Payer: Self-pay | Admitting: Internal Medicine

## 2023-11-29 DIAGNOSIS — B351 Tinea unguium: Secondary | ICD-10-CM

## 2023-11-29 NOTE — Telephone Encounter (Signed)
 Copied from CRM [id number]. Topic: Clinical - Medication Refill >> Nov 29, 2023 11:38 AM Deleta S wrote: Medication: Terbinafine  250 mg  Has the patient contacted their pharmacy? Yes (Agent: If no, request that the patient contact the pharmacy for the refill. If patient does not wish to contact the pharmacy document the reason why and proceed with request.) (Agent: If yes, when and what did the pharmacy advise?)  This is the patient's preferred pharmacy:  [id number] [place], [place] - [address] AT ACROSS FROM [facility] [address] [place] [address] Phone: [phone] Fax: [phone]    Is this the correct pharmacy for this prescription? Yes If no, delete pharmacy and type the correct one.   Has the prescription been filled recently? No  Is the patient out of the medication? Yes  Has the patient been seen for an appointment in the last year OR does the patient have an upcoming appointment? Yes  Can we respond through MyChart? Yes  Agent: Please be advised that Rx refills may take up to 3 business days. We ask that you follow-up with your pharmacy.

## 2023-11-29 NOTE — Telephone Encounter (Signed)
 Ordered today

## 2023-11-29 NOTE — Telephone Encounter (Signed)
 Last OV 08/01/23 Next OV not scheduled  Last refill 09/02/23 Qty #90/0

## 2023-11-30 NOTE — Telephone Encounter (Signed)
 Last OV 08/01/23 Next OV not scheduled  Last refill 09/05/23 Qty #90/0

## 2023-12-31 ENCOUNTER — Other Ambulatory Visit: Payer: Self-pay | Admitting: Internal Medicine

## 2023-12-31 DIAGNOSIS — F411 Generalized anxiety disorder: Secondary | ICD-10-CM

## 2024-02-07 ENCOUNTER — Other Ambulatory Visit: Payer: Self-pay | Admitting: Internal Medicine

## 2024-02-07 DIAGNOSIS — F411 Generalized anxiety disorder: Secondary | ICD-10-CM

## 2024-02-07 NOTE — Telephone Encounter (Signed)
 Copied from CRM [id number]. Topic: Clinical - Medication Refill >> Feb 07, 2024  9:04 AM Thersia C wrote: Medication: traZODone  (DESYREL ) 50 MG tablet  tadalafil (CIALIS) 5 MG tablet  Has the patient contacted their pharmacy? Yes (Agent: If no, request that the patient contact the pharmacy for the refill. If patient does not wish to contact the pharmacy document the reason why and proceed with request.) (Agent: If yes, when and what did the pharmacy advise?)  This is the patient's preferred pharmacy:  [id number] - [place], [place] - [address] AT ACROSS FROM [facility] [address] [place] [place] [address] Phone: [phone] Fax: [phone]  Is this the correct pharmacy for this prescription? Yes If no, delete pharmacy and type the correct one.   Has the prescription been filled recently? No  Is the patient out of the medication? Yes  Has the patient been seen for an appointment in the last year OR does the patient have an upcoming appointment? Yes  Can we respond through MyChart? Yes  Agent: Please be advised that Rx refills may take up to 3 business days. We ask that you follow-up with your pharmacy.

## 2024-02-10 MED ORDER — TRAZODONE HCL 50 MG PO TABS: ORAL_TABLET | ORAL | 0 refills | Status: DC

## 2024-02-10 MED ORDER — TADALAFIL 5 MG PO TABS: 5.0000 mg | ORAL_TABLET | Freq: Every day | ORAL | 0 refills | Status: DC

## 2024-03-04 ENCOUNTER — Ambulatory Visit: Admitting: Internal Medicine

## 2024-03-04 ENCOUNTER — Encounter: Payer: Self-pay | Admitting: Internal Medicine

## 2024-03-04 ENCOUNTER — Ambulatory Visit: Payer: Self-pay | Admitting: Internal Medicine

## 2024-03-04 ENCOUNTER — Other Ambulatory Visit

## 2024-03-04 VITALS — BP 116/84 | HR 59 | Temp 98.9°F | Ht 69.0 in | Wt 168.6 lb

## 2024-03-04 DIAGNOSIS — F411 Generalized anxiety disorder: Secondary | ICD-10-CM

## 2024-03-04 DIAGNOSIS — B351 Tinea unguium: Secondary | ICD-10-CM

## 2024-03-04 LAB — CBC WITH DIFFERENTIAL/PLATELET
Basophils Absolute: 0 K/uL (ref 0.0–0.1)
Basophils Relative: 0.5 % (ref 0.0–3.0)
Eosinophils Absolute: 0.3 K/uL (ref 0.0–0.7)
Eosinophils Relative: 4.8 % (ref 0.0–5.0)
HCT: 40.7 % (ref 39.0–52.0)
Hemoglobin: 13.7 g/dL (ref 13.0–17.0)
Lymphocytes Relative: 19.3 % (ref 12.0–46.0)
Lymphs Abs: 1.4 K/uL (ref 0.7–4.0)
MCHC: 33.7 g/dL (ref 30.0–36.0)
MCV: 90.4 fl (ref 78.0–100.0)
Monocytes Absolute: 0.5 K/uL (ref 0.1–1.0)
Monocytes Relative: 6.6 % (ref 3.0–12.0)
Neutro Abs: 4.8 K/uL (ref 1.4–7.7)
Neutrophils Relative %: 68.8 % (ref 43.0–77.0)
Platelets: 265 K/uL (ref 150.0–400.0)
RBC: 4.5 Mil/uL (ref 4.22–5.81)
RDW: 13.1 % (ref 11.5–15.5)
WBC: 7 K/uL (ref 4.0–10.5)

## 2024-03-04 LAB — HEPATIC FUNCTION PANEL
ALT: 18 U/L (ref 0–53)
AST: 21 U/L (ref 0–37)
Albumin: 4.5 g/dL (ref 3.5–5.2)
Alkaline Phosphatase: 53 U/L (ref 39–117)
Bilirubin, Direct: 0.1 mg/dL (ref 0.0–0.3)
Total Bilirubin: 0.4 mg/dL (ref 0.2–1.2)
Total Protein: 7.1 g/dL (ref 6.0–8.3)

## 2024-03-04 LAB — BASIC METABOLIC PANEL WITH GFR
BUN: 16 mg/dL (ref 6–23)
CO2: 27 meq/L (ref 19–32)
Calcium: 9.3 mg/dL (ref 8.4–10.5)
Chloride: 102 meq/L (ref 96–112)
Creatinine, Ser: 1.22 mg/dL (ref 0.40–1.50)
GFR: 74.15 mL/min (ref >60.00)
Glucose, Bld: 85 mg/dL (ref 70–99)
Potassium: 4 meq/L (ref 3.5–5.1)
Sodium: 137 meq/L (ref 135–145)

## 2024-03-04 MED ORDER — TRAZODONE HCL 100 MG PO TABS: 100.0000 mg | ORAL_TABLET | Freq: Every day | ORAL | 1 refills | Status: AC

## 2024-03-04 MED ORDER — DULOXETINE HCL 60 MG PO CPEP: 60.0000 mg | ORAL_CAPSULE | Freq: Every day | ORAL | 1 refills | Status: AC

## 2024-03-04 MED ORDER — TERBINAFINE HCL 250 MG PO TABS: 250.0000 mg | ORAL_TABLET | Freq: Every day | ORAL | 0 refills | Status: AC

## 2024-03-04 NOTE — Patient Instructions (Signed)

## 2024-03-04 NOTE — Progress Notes (Unsigned)
 Subjective:  Patient ID: Blake Chapman, male    DOB: [birth year]  Age: 38 y.o. MRN: [id number]  CC: Medical Management of Chronic Issues (6 month follow up /)   HPI Blake Chapman presents for f/up ----  Discussed the use of AI scribe software for clinical note transcription with the patient, who gave verbal consent to proceed.  History of Present Illness Blake Chapman [name] is a 38 year old male with ulcerative colitis who presents for medication management and follow-up.  He has been managing ulcerative colitis since his diagnosis in 2017. He is currently on Waslana, a biosimilar to Stelara , which he finds to be the most effective treatment. He feels closest to normal and attributes any gastrointestinal distress more to dietary choices than to his ulcerative colitis. He undergoes regular colonoscopies, at least biennially, due to his condition.  He experiences insomnia, characterized by waking up at 3:30-4:00 AM, which he attributes to work pressures, leading to fatigue. He has been taking trazodone  50 mg nightly for several years and has previously used a dose of 100 mg with good effect to manage his sleep.  He is currently taking terbinafine  for toenail fungus, which he started in April. The medication is working well, but he is not fully cured yet. No side effects from his medications.  No chest pain, shortness of breath, dizziness, or lightheadedness during physical activities such as biking. His back pain is well-controlled without any numbness, weakness, or tingling.  He received both the flu and COVID vaccines at the end of September. He has a family history of prostate cancer in an uncle and colon cancer in a grandmother who was diagnosed at 42.  He moved to [place] in January and lives with his grandmother. He has a history of living in various locations due to his father's career, including [place] and [employer] in [address]. 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 He finds social media overwhelming and prefers activities like biking and meditation to manage stress.        Outpatient Medications Prior to Visit  Medication Sig Dispense Refill   Aflibercept (EYLEA) 2 MG/0.05ML SOLN by Intravitreal route. Every 12 weeks.     calcium carbonate (OS-CAL - DOSED IN MG OF ELEMENTAL CALCIUM) 1250 (500 Ca) MG tablet Take 1 tablet by mouth daily.     fluticasone (FLONASE) 50 MCG/ACT nasal spray Place into both nostrils daily.     Loratadine (CLARITIN PO) Take by mouth.     Multiple Vitamin (MULTIVITAMIN) capsule Take 1 capsule by mouth daily.     omeprazole (PRILOSEC) 10 MG capsule Take 10 mg by mouth daily.     propranolol  (INDERAL ) 10 MG tablet TAKE 1 TABLET BY MOUTH EVERY DAY AS NEEDED 90 tablet 0   tadalafil (CIALIS) 5 MG tablet Take 1 tablet (5 mg total) by mouth daily. 10 tablet 0   Ustekinumab -auub (WEZLANA ) 90 MG/ML SOSY Inject 90 mg into the skin every 8 (eight) weeks. 1 mL 6   VITAMIN D , CHOLECALCIFEROL, PO Take by mouth.     DULoxetine  (CYMBALTA ) 60 MG capsule TAKE 1 CAPSULE BY MOUTH EVERY DAY 90 capsule 0   STELARA  90 MG/ML SOSY injection INJECT 1 SYRINGE SUBCUTANEOUSLY  EVERY 8 WEEKS 1 mL 5   terbinafine  (LAMISIL ) 250 MG tablet TAKE 1 TABLET BY MOUTH EVERY DAY 30 tablet 2   traZODone  (DESYREL ) 50 MG tablet TAKE 1 TABLET BY MOUTH EVERY DAY AT BEDTIME AS NEEDED  90 tablet 0   No facility-administered medications prior to visit.    ROS Review of Systems  Objective:  BP 116/84 (BP Location: Left Arm, Patient Position: Sitting, Cuff Size: Normal)   Pulse (!) 59   Temp 98.9 F (37.2 C) (Oral)   Ht 5' 9 (1.753 m)   Wt 168 lb 9.6 oz (76.5 kg)   SpO2 98%   BMI 24.90 kg/m   BP Readings from Last 3 Encounters:  03/04/24 116/84  08/09/23 108/68  08/01/23 132/86    Wt Readings from Last 3 Encounters:  03/04/24 168 lb 9.6 oz (76.5 kg)  08/09/23 170 lb (77.1 kg)  08/01/23 169 lb 6.4 oz (76.8 kg)    Physical  Exam  Lab Results  Component Value Date   WBC 7.0 03/04/2024   HGB 13.7 03/04/2024   HCT 40.7 03/04/2024   PLT 265.0 03/04/2024   GLUCOSE 85 03/04/2024   CHOL 216 (H) 08/01/2023   TRIG 132.0 08/01/2023   HDL 55.10 08/01/2023   LDLDIRECT 101.0 05/13/2020   LDLCALC 135 (H) 08/01/2023   ALT 18 03/04/2024   AST 21 03/04/2024   NA 137 03/04/2024   K 4.0 03/04/2024   CL 102 03/04/2024   CREATININE 1.22 03/04/2024   BUN 16 03/04/2024   CO2 27 03/04/2024   TSH 1.95 08/01/2023    No results found.  Assessment & Plan:  Onychomycosis of toenail -     Basic metabolic panel with GFR; Future -     CBC with Differential/Platelet; Future -     Hepatic function panel; Future -     Terbinafine  HCl; Take 1 tablet (250 mg total) by mouth daily.  Dispense: 90 tablet; Refill: 0  GAD (generalized anxiety disorder) -     traZODone  HCl; Take 1 tablet (100 mg total) by mouth at bedtime.  Dispense: 90 tablet; Refill: 1 -     DULoxetine  HCl; Take 1 capsule (60 mg total) by mouth daily.  Dispense: 90 capsule; Refill: 1     Follow-up: Return in about 6 months (around 09/01/2024).  Debby Molt, MD

## 2024-03-19 ENCOUNTER — Telehealth: Payer: Self-pay

## 2024-03-19 ENCOUNTER — Other Ambulatory Visit (HOSPITAL_COMMUNITY): Payer: Self-pay

## 2024-03-19 NOTE — Telephone Encounter (Signed)
 Pharmacy Patient Advocate Encounter   Received notification from CoverMyMeds that prior authorization for Wezlana  90MG /ML syringes is required/requested.   Insurance verification completed.   The patient is insured through [facility].   Per test claim: PA required; PA submitted to above mentioned insurance via Fax Key/confirmation #/EOC [id number]  Status is pending

## 2024-03-19 NOTE — Telephone Encounter (Signed)
 Pharmacy Patient Advocate Encounter  Received notification from [facility] that Prior Authorization for Wezlana  90MG /ML syringes  has been APPROVED from 03-19-2024 to 03-19-2025 for 6 units.   PA #/Case ID/Reference #: [id number]

## 2024-03-19 NOTE — Telephone Encounter (Signed)
 Noted pt. advised

## 2024-04-24 ENCOUNTER — Other Ambulatory Visit: Payer: Self-pay | Admitting: Internal Medicine

## 2024-04-24 DIAGNOSIS — F418 Other specified anxiety disorders: Secondary | ICD-10-CM
